# Patient Record
Sex: Female | Born: 1960 | Race: White | Hispanic: No | Marital: Married | Smoking: Never smoker
Health system: Southern US, Community
[De-identification: ages and names within clinical notes are randomized; demographics above are authoritative.]

## PROBLEM LIST (undated history)

## (undated) DIAGNOSIS — J45909 Unspecified asthma, uncomplicated: Secondary | ICD-10-CM

## (undated) DIAGNOSIS — R55 Syncope and collapse: Secondary | ICD-10-CM

## (undated) HISTORY — PX: APPENDECTOMY: SHX54

## (undated) HISTORY — DX: Unspecified asthma, uncomplicated: J45.909

---

## 1998-08-11 ENCOUNTER — Encounter: Payer: Self-pay | Admitting: Emergency Medicine

## 1998-08-11 ENCOUNTER — Emergency Department (HOSPITAL_COMMUNITY): Admission: EM | Admit: 1998-08-11 | Discharge: 1998-08-11 | Payer: Self-pay | Admitting: Emergency Medicine

## 2000-11-14 ENCOUNTER — Other Ambulatory Visit: Admission: RE | Admit: 2000-11-14 | Discharge: 2000-11-14 | Payer: Self-pay | Admitting: Obstetrics & Gynecology

## 2001-07-13 ENCOUNTER — Other Ambulatory Visit: Admission: RE | Admit: 2001-07-13 | Discharge: 2001-07-13 | Payer: Self-pay | Admitting: Obstetrics & Gynecology

## 2005-02-24 ENCOUNTER — Emergency Department (HOSPITAL_COMMUNITY): Admission: EM | Admit: 2005-02-24 | Discharge: 2005-02-24 | Payer: Self-pay | Admitting: Emergency Medicine

## 2013-01-17 ENCOUNTER — Observation Stay (HOSPITAL_COMMUNITY)
Admission: EM | Admit: 2013-01-17 | Discharge: 2013-01-19 | Disposition: A | Discharge status: Home / Self Care | Payer: BC Managed Care – PPO | Attending: Surgery | Admitting: Surgery

## 2013-01-17 ENCOUNTER — Encounter (HOSPITAL_COMMUNITY): Payer: Self-pay | Admitting: Emergency Medicine

## 2013-01-17 ENCOUNTER — Emergency Department (HOSPITAL_COMMUNITY): Payer: BC Managed Care – PPO

## 2013-01-17 DIAGNOSIS — R7401 Elevation of levels of liver transaminase levels: Secondary | ICD-10-CM | POA: Diagnosis present

## 2013-01-17 DIAGNOSIS — R7402 Elevation of levels of lactic acid dehydrogenase (LDH): Secondary | ICD-10-CM | POA: Insufficient documentation

## 2013-01-17 DIAGNOSIS — N390 Urinary tract infection, site not specified: Secondary | ICD-10-CM | POA: Insufficient documentation

## 2013-01-17 DIAGNOSIS — K8010 Calculus of gallbladder with chronic cholecystitis without obstruction: Secondary | ICD-10-CM | POA: Diagnosis present

## 2013-01-17 LAB — CBC WITH DIFFERENTIAL/PLATELET
Basophils Absolute: 0 10*3/uL (ref 0.0–0.1)
Eosinophils Relative: 1 % (ref 0–5)
Lymphs Abs: 0.9 10*3/uL (ref 0.7–4.0)
MCV: 91.2 fL (ref 78.0–100.0)
Neutro Abs: 5.9 10*3/uL (ref 1.7–7.7)
Neutrophils Relative %: 79 % — ABNORMAL HIGH (ref 43–77)
Platelets: 308 10*3/uL (ref 150–400)
RDW: 12.3 % (ref 11.5–15.5)
WBC: 7.4 10*3/uL (ref 4.0–10.5)

## 2013-01-17 LAB — URINALYSIS, ROUTINE W REFLEX MICROSCOPIC
Glucose, UA: NEGATIVE mg/dL
Hgb urine dipstick: NEGATIVE
Specific Gravity, Urine: 1.026 (ref 1.005–1.030)

## 2013-01-17 LAB — COMPREHENSIVE METABOLIC PANEL
AST: 321 U/L — ABNORMAL HIGH (ref 0–37)
CO2: 25 mEq/L (ref 19–32)
GFR calc Af Amer: >90 mL/min (ref >90)
GFR calc non Af Amer: >90 mL/min (ref >90)
Glucose, Bld: 98 mg/dL (ref 70–99)
Sodium: 139 mEq/L (ref 135–145)
Total Protein: 7.3 g/dL (ref 6.0–8.3)

## 2013-01-17 LAB — URINE MICROSCOPIC-ADD ON

## 2013-01-17 LAB — LIPASE, BLOOD: Lipase: 111 U/L — ABNORMAL HIGH (ref 11–59)

## 2013-01-17 MED ORDER — IOHEXOL 300 MG/ML  SOLN
100.0000 mL | Freq: Once | INTRAMUSCULAR | Status: AC | PRN
  Administered 2013-01-17: 100 mL via INTRAVENOUS

## 2013-01-17 MED ORDER — MORPHINE SULFATE 2 MG/ML IJ SOLN
2.0000 mg | Freq: Once | INTRAMUSCULAR | Status: AC
  Administered 2013-01-17: 2 mg via INTRAVENOUS
  Filled 2013-01-17: qty 1

## 2013-01-17 MED ORDER — ONDANSETRON HCL 4 MG/2ML IJ SOLN
4.0000 mg | Freq: Once | INTRAMUSCULAR | Status: AC
  Administered 2013-01-17: 4 mg via INTRAVENOUS
  Filled 2013-01-17: qty 2

## 2013-01-17 MED ORDER — SODIUM CHLORIDE 0.9 % IV BOLUS (SEPSIS)
1000.0000 mL | Freq: Once | INTRAVENOUS | Status: AC
  Administered 2013-01-17: 1000 mL via INTRAVENOUS

## 2013-01-17 MED ORDER — IOHEXOL 300 MG/ML  SOLN
25.0000 mL | INTRAMUSCULAR | Status: DC
  Administered 2013-01-17: 25 mL via ORAL

## 2013-01-17 NOTE — ED Provider Notes (Signed)
This patient was seen in conjunction with the resident physician, Dr. Yetta Barre.  The documentation is accurate and details the evaluation.  On my exam, patient was feeling better, had received analgesia.  With her description of epigastric and right upper quadrant pain, hepatobiliary dysfunction was an immediate suspicion.  Patient's evaluation was most notable for elevated transaminases, and CT findings are notable for enhanced walls of the gallbladder with distention, prominent intrahepatic biliary ducts.  With concern for cholecystitis, we consulted our surgical colleagues.  This was pending on sign-out.  Gerhard Munch, MD 01/17/13 [phone]

## 2013-01-17 NOTE — ED Provider Notes (Signed)
CSN: [id number]     Arrival date & time 01/17/13  1759 History   None    Chief Complaint  Patient presents with  . Abdominal Pain   (Consider location/radiation/quality/duration/timing/severity/associated sxs/prior Treatment) HPI Grace Tucker is a 52 y.o. female w/ no significant PMHx, presents to the ED w/ complaints of severe epigastric pain since Sunday night. She says the pain started after she ate a cheeseburger w/ a milkshake. Since that time, she has not been able to eat or drink anything 2/2 decreased appetite and nausea, but with no vomiting. The patient claims the pain is a very sharp stabbing pain, 10/10 in severity, radiating to her back, also w/ some RUQ pain. She claims there is nothing she can do to make it feel better and also says it is much worse after eating. She denies any fever, chills, no diarrhea, melena, or hematochezia. She denies a history of gallstones, no cholecystectomy in the past, no alcohol abuse. She also describes some recent dizziness, lightheadedness and weakness since she has not been able to take anything po. The patient was seen at urgent care yesterday, was given a GI cocktail with some relief. She was also given a prescription for Cipro for a UTI.   History reviewed. No pertinent past medical history. History reviewed. No pertinent past surgical history. History reviewed. No pertinent family history. History  Substance Use Topics  . Smoking status: Never Smoker   . Smokeless tobacco: Not on file  . Alcohol Use: No   OB History   Grav Para Term Preterm Abortions TAB SAB Ect Mult Living                 Review of Systems General: Positive for decreased appetite, and fatigue. Denies fever, chills, diaphoresis. Respiratory: Denies SOB, DOE, cough, chest tightness, and wheezing.   Cardiovascular: Denies chest pain, palpitations and leg swelling.  Gastrointestinal: Positive for nausea, and severe epigastric pain. Denies vomiting,  diarrhea, constipation, blood in stool and abdominal distention.  Genitourinary: Denies dysuria, urgency, frequency, hematuria, flank pain and difficulty urinating.  Musculoskeletal: Denies myalgias, back pain, joint swelling, arthralgias and gait problem.  Skin: Denies pallor, rash and wounds.  Neurological: Positive for dizziness, lightheadedness, and weakness. Denies seizures, syncope, numbness and headaches.  Psychiatric/Behavioral: Denies mood changes, confusion, nervousness, sleep disturbance and agitation.  Allergies  Ceclor  Home Medications   Current Outpatient Rx  Name  Route  Sig  Dispense  Refill  . acetaminophen (TYLENOL) 500 MG tablet   Oral   Take 1,000 mg by mouth every 6 (six) hours as needed for mild pain.         Marland Kitchen bismuth subsalicylate (PEPTO BISMOL) 262 MG chewable tablet   Oral   Chew 524 mg by mouth daily as needed for indigestion.         . ciprofloxacin (CIPRO) 500 MG tablet   Oral   Take 500 mg by mouth 2 (two) times daily. For 7 days.         Marland Kitchen ibuprofen (ADVIL,MOTRIN) 200 MG tablet   Oral   Take 200 mg by mouth every 6 (six) hours as needed for moderate pain.         Marland Kitchen omeprazole (PRILOSEC) 40 MG capsule   Oral   Take 40 mg by mouth at bedtime.         . ondansetron (ZOFRAN-ODT) 4 MG disintegrating tablet   Oral   Take 4 mg by mouth every 6 (six) hours as  needed for nausea or vomiting. For 3 days         . ranitidine (ZANTAC) 150 MG tablet   Oral   Take 150 mg by mouth daily.           Physical Exam Filed Vitals:   01/17/13 2030 01/17/13 2134 01/17/13 2230 01/17/13 2304  BP: 145/70 125/70 115/68 100/67  Pulse: 69 80 76   Temp:      TempSrc:      Resp:      SpO2: 100% 98% 99%    General: Vital signs reviewed.  Patient is a well-developed and well-nourished, in moderate distress d/t pain, cooperative with exam. Alert and oriented x3.  Head: Normocephalic and atraumatic. Eyes: PERRL, EOMI, conjunctivae normal, No scleral  icterus.  Neck: Supple, trachea midline, normal ROM, No JVD, masses, thyromegaly, or carotid bruit present.  Cardiovascular: RRR, S1 normal, S2 normal, no murmurs, gallops, or rubs. Pulmonary/Chest: Normal respiratory effort, CTAB, no wheezes, rales, or rhonchi. Abdominal: Soft, very tender in the epigastrium as well as tenderness in RUQ. Non-distended, bowel sounds are normal, no masses, organomegaly, or guarding present.  Musculoskeletal: No joint deformities, erythema, or stiffness, ROM full and no nontender. Extremities: No swelling or edema,  pulses symmetric and intact bilaterally. No cyanosis or clubbing. Neurological: A&O x3, Strength is normal and symmetric bilaterally, cranial nerve II-XII are grossly intact, no focal motor deficit, sensory intact to light touch bilaterally.  Skin: Warm, dry and intact. No rashes or erythema. Psychiatric: Normal mood and affect. speech and behavior is normal. Cognition and memory are normal.   ED Course  Procedures (including critical care time) Labs Review Labs Reviewed  CBC WITH DIFFERENTIAL - Abnormal; Notable for the following:    Neutrophils Relative % 79 (*)    All other components within normal limits  COMPREHENSIVE METABOLIC PANEL - Abnormal; Notable for the following:    AST 321 (*)    ALT 857 (*)    Alkaline Phosphatase 264 (*)    Total Bilirubin 2.9 (*)    All other components within normal limits  LIPASE, BLOOD - Abnormal; Notable for the following:    Lipase 111 (*)    All other components within normal limits  URINALYSIS, ROUTINE W REFLEX MICROSCOPIC - Abnormal; Notable for the following:    Color, Urine AMBER (*)    APPearance HAZY (*)    Bilirubin Urine SMALL (*)    Ketones, ur >80 (*)    Protein, ur 30 (*)    Urobilinogen, UA 2.0 (*)    Leukocytes, UA LARGE (*)    All other components within normal limits  URINE MICROSCOPIC-ADD ON - Abnormal; Notable for the following:    Squamous Epithelial / LPF MANY (*)    Bacteria,  UA MANY (*)    All other components within normal limits  URINE CULTURE   Imaging Review Ct Abdomen Pelvis W Contrast  01/17/2013   CLINICAL DATA:  Upper to mid abdominal pain and nausea.  EXAM: CT ABDOMEN AND PELVIS WITH CONTRAST  TECHNIQUE: Multidetector CT imaging of the abdomen and pelvis was performed using the standard protocol following bolus administration of intravenous contrast.  CONTRAST:  OMNIPAQUE IOHEXOL 300 MG/ML  SOLN  COMPARISON:  CT of the abdomen and pelvis performed 02/24/2005, and abdominal ultrasound performed earlier today at 8:48 p.m.  FINDINGS: The visualized lung bases are clear.  There is mild distention of the gallbladder, with mildly increased wall enhancement and perhaps trace pericholecystic fluid on coronal  images. There is associated mild prominence of the intrahepatic biliary ducts, slightly more prominent than in 2007. The common hepatic duct is dilated to 9 mm in maximal diameter. This raises concern for distal obstruction, though no definite stone is seen.  A 6 mm hypodensity within the right hepatic lobe is only minimally changed from 2007 and is likely benign. The liver is otherwise unremarkable in appearance. The spleen is within normal limits. The pancreas and adrenal glands are otherwise unremarkable.  A few small hypodensities are noted within the left kidney, likely reflecting small cysts. The kidneys are otherwise unremarkable in appearance. There is no evidence of hydronephrosis. No renal or ureteral stones are seen. No perinephric stranding is appreciated.  The small bowel is unremarkable in appearance. The stomach is within normal limits. No acute vascular abnormalities are seen.  The appendix is not definitely seen; there is no evidence for appendicitis. The colon is unremarkable in appearance.  The bladder is mildly distended and grossly unremarkable. The uterus is within normal limits. The ovaries are relatively symmetric. No suspicious adnexal masses  are seen. A small amount of free fluid within the pelvis is likely physiologic in nature. No inguinal lymphadenopathy is seen.  No acute osseous abnormalities are identified.  IMPRESSION: 1. Mild gallbladder distention, with mildly increased wall enhancement and perhaps trace pericholecystic fluid. Mild prominence of the intrahepatic biliary ducts, and dilatation of the common hepatic duct to 9 mm in maximal diameter. This raises concern for distal obstruction, though no definite stone is seen. MRCP or ERCP could be considered for further evaluation. 2. Likely tiny hepatic cyst and small left renal cysts.   Electronically Signed   By: Roanna Raider M.D.   On: 01/17/2013 23:33   US Abdomen Limited  01/17/2013   CLINICAL DATA:  Epigastric pain.  Nausea.  EXAM: US ABDOMEN LIMITED - RIGHT UPPER QUADRANT  COMPARISON:  CT, 02/24/2005  FINDINGS: Gallbladder  Multiple gallstones. No wall thickening or pericholecystic fluid. No evidence of acute cholecystitis.  Common bile duct  Diameter: 12 mm proximally with some distal tapering. No duct stone is seen.  Liver:  Mild central intrahepatic bile duct dilation. Liver is normal in size and overall echogenicity. No mass or focal lesion.  Oval soft tissue mass is seen along the distal posterior gastrohepatic ligament most likely an enlarged lymph node. It measures 3.2 cm x 1.8 cm x 1.9 cm. A smaller, 10.5 cm short axis, node was noted in this location on the prior CT.  IMPRESSION: 1. Intra and extra hepatic bile duct dilation, increased when compared to the prior CT. The most distal aspect the duct was not fully visualized. A distal stone is not excluded. 2. Oval soft tissue mass along the posterior gastrohepatic ligament most likely an enlarged lymph node, larger than it was on the prior CT. 3. Gallstones without evidence of acute cholecystitis. 4. Recommend followup CT of the abdomen with contrast for further evaluation of the gastrohepatic ligament mass/ adenopathy and  duct dilation.   Electronically Signed   By: Amie Portland M.D.   On: 01/17/2013 21:47    EKG Interpretation   None       MDM   Grace Tucker is a 52 y.o. female w/ no significant PMHx, presents to the ED w/ complaints of severe epigastric pain after a large meal on Sunday. The pain is a sharp stabbing pain, radiating to the back w/ significant epigastric tenderness on exam as well as RUQ. Patient denies  significant alcohol intake, no history of gall stones in the past. Had an appendectomy as a child. Given clinical presentation, obvious concern for pancreatitis, cholecystitis, and peptic ulcer disease.  -Given NS 1L bolus. -Morphine 4 mg IV for pain + Zofran 4 mg for nausea w/ significant improvement in symptoms. -CBC wnl, but w/ relative PMNs of 79% -CMET shows Alk phos of 264, AST 321, ALT 857, total bili of 2.9, and lipase of 111. Given these findings, suspect cholecystitis. -Abdominal US shows extra-hepatic bile duct dilation, w/ distal stone not excluded. No evidence of cholecystitis (see full details above). Recommended CT abdomen w/ contrast. -CT abdomen shows mild gallbladder distention, with mildly increased wall enhancement and perhaps trace pericholecystic fluid. Also w/ mild prominence of the intrahepatic biliary ducts, and dilatation of the common hepatic duct to 9 mm in maximal diameter.  -UA also performed, showed signs of UTI (see above), however patient already taking Cipro for this as given by urgent care.  Given LFT's, clinical presentation, and imaging, discussed w/ surgery, will comes see patient.   Courtney Paris, MD 01/17/13 2350

## 2013-01-17 NOTE — ED Notes (Signed)
Notified CT that pt had finished contrast.  

## 2013-01-17 NOTE — ED Notes (Signed)
Patient transported to CT 

## 2013-01-17 NOTE — ED Notes (Signed)
Pt c/o upper to mid abd pain with nausea intermittent after eating since Sunday

## 2013-01-18 ENCOUNTER — Inpatient Hospital Stay (HOSPITAL_COMMUNITY): Payer: BC Managed Care – PPO

## 2013-01-18 ENCOUNTER — Encounter (HOSPITAL_COMMUNITY): Admission: EM | Disposition: A | Discharge status: Home / Self Care | Payer: Self-pay | Source: Home / Self Care

## 2013-01-18 ENCOUNTER — Encounter (HOSPITAL_COMMUNITY): Payer: BC Managed Care – PPO | Admitting: Anesthesiology

## 2013-01-18 ENCOUNTER — Encounter (HOSPITAL_COMMUNITY): Payer: Self-pay | Admitting: *Deleted

## 2013-01-18 ENCOUNTER — Inpatient Hospital Stay (HOSPITAL_COMMUNITY): Payer: BC Managed Care – PPO | Admitting: Anesthesiology

## 2013-01-18 DIAGNOSIS — R740 Nonspecific elevation of levels of transaminase and lactic acid dehydrogenase [LDH]: Secondary | ICD-10-CM

## 2013-01-18 DIAGNOSIS — K8010 Calculus of gallbladder with chronic cholecystitis without obstruction: Secondary | ICD-10-CM

## 2013-01-18 DIAGNOSIS — K8020 Calculus of gallbladder without cholecystitis without obstruction: Secondary | ICD-10-CM

## 2013-01-18 HISTORY — PX: CHOLECYSTECTOMY: SHX55

## 2013-01-18 LAB — CBC
HCT: 35 % — ABNORMAL LOW (ref 36.0–46.0)
Hemoglobin: 12.1 g/dL (ref 12.0–15.0)
MCHC: 34.6 g/dL (ref 30.0–36.0)
Platelets: 257 10*3/uL (ref 150–400)
RBC: 3.86 MIL/uL — ABNORMAL LOW (ref 3.87–5.11)
WBC: 7.2 10*3/uL (ref 4.0–10.5)

## 2013-01-18 LAB — COMPREHENSIVE METABOLIC PANEL
ALT: 587 U/L — ABNORMAL HIGH (ref 0–35)
Albumin: 3.2 g/dL — ABNORMAL LOW (ref 3.5–5.2)
BUN: 7 mg/dL (ref 6–23)
CO2: 20 mEq/L (ref 19–32)
Calcium: 8 mg/dL — ABNORMAL LOW (ref 8.4–10.5)
Chloride: 107 mEq/L (ref 96–112)
Creatinine, Ser: 0.71 mg/dL (ref 0.50–1.10)
GFR calc Af Amer: >90 mL/min (ref >90)
GFR calc non Af Amer: >90 mL/min (ref >90)
Total Bilirubin: 1.4 mg/dL — ABNORMAL HIGH (ref 0.3–1.2)
Total Protein: 5.7 g/dL — ABNORMAL LOW (ref 6.0–8.3)

## 2013-01-18 LAB — MRSA PCR SCREENING: MRSA by PCR: NEGATIVE

## 2013-01-18 LAB — URINE CULTURE: Culture: NO GROWTH

## 2013-01-18 LAB — LIPASE, BLOOD: Lipase: 73 U/L — ABNORMAL HIGH (ref 11–59)

## 2013-01-18 SURGERY — LAPAROSCOPIC CHOLECYSTECTOMY WITH INTRAOPERATIVE CHOLANGIOGRAM
Anesthesia: General

## 2013-01-18 MED ORDER — PROPOFOL 10 MG/ML IV BOLUS
INTRAVENOUS | Status: DC | PRN
  Administered 2013-01-18: 50 mg via INTRAVENOUS
  Administered 2013-01-18: 150 mg via INTRAVENOUS

## 2013-01-18 MED ORDER — BUPIVACAINE-EPINEPHRINE (PF) 0.25% -1:200000 IJ SOLN
INTRAMUSCULAR | Status: AC
  Filled 2013-01-18: qty 30

## 2013-01-18 MED ORDER — MORPHINE SULFATE 2 MG/ML IJ SOLN
2.0000 mg | INTRAMUSCULAR | Status: DC | PRN
  Administered 2013-01-18 – 2013-01-19 (×3): 2 mg via INTRAVENOUS
  Filled 2013-01-18 (×3): qty 1

## 2013-01-18 MED ORDER — BIOTENE DRY MOUTH MT LIQD: 15.0000 mL | Freq: Two times a day (BID) | OROMUCOSAL | Status: DC

## 2013-01-18 MED ORDER — DIPHENHYDRAMINE HCL 50 MG/ML IJ SOLN: 12.5000 mg | Freq: Four times a day (QID) | INTRAMUSCULAR | Status: DC | PRN

## 2013-01-18 MED ORDER — DIPHENHYDRAMINE HCL 12.5 MG/5ML PO ELIX: 12.5000 mg | ORAL_SOLUTION | Freq: Four times a day (QID) | ORAL | Status: DC | PRN

## 2013-01-18 MED ORDER — INFLUENZA VAC SPLIT QUAD 0.5 ML IM SUSP
0.5000 mL | INTRAMUSCULAR | Status: DC
  Filled 2013-01-18: qty 0.5

## 2013-01-18 MED ORDER — MIDAZOLAM HCL 5 MG/5ML IJ SOLN
INTRAMUSCULAR | Status: DC | PRN
  Administered 2013-01-18: 2 mg via INTRAVENOUS

## 2013-01-18 MED ORDER — GLYCOPYRROLATE 0.2 MG/ML IJ SOLN
INTRAMUSCULAR | Status: DC | PRN
  Administered 2013-01-18: 0.6 mg via INTRAVENOUS

## 2013-01-18 MED ORDER — SODIUM CHLORIDE 0.9 % IV SOLN
INTRAVENOUS | Status: DC | PRN
  Administered 2013-01-18: 14:00:00

## 2013-01-18 MED ORDER — SUCCINYLCHOLINE CHLORIDE 20 MG/ML IJ SOLN
INTRAMUSCULAR | Status: DC | PRN
  Administered 2013-01-18: 120 mg via INTRAVENOUS

## 2013-01-18 MED ORDER — OXYCODONE HCL 5 MG/5ML PO SOLN: 5.0000 mg | Freq: Once | ORAL | Status: DC | PRN

## 2013-01-18 MED ORDER — LIDOCAINE HCL (CARDIAC) 20 MG/ML IV SOLN
INTRAVENOUS | Status: DC | PRN
  Administered 2013-01-18: 50 mg via INTRAVENOUS

## 2013-01-18 MED ORDER — ONDANSETRON HCL 4 MG/2ML IJ SOLN
4.0000 mg | Freq: Four times a day (QID) | INTRAMUSCULAR | Status: DC | PRN
  Administered 2013-01-18: 4 mg via INTRAVENOUS
  Filled 2013-01-18: qty 2

## 2013-01-18 MED ORDER — ONDANSETRON HCL 4 MG/2ML IJ SOLN
INTRAMUSCULAR | Status: DC | PRN
  Administered 2013-01-18: 4 mg via INTRAVENOUS

## 2013-01-18 MED ORDER — OXYCODONE HCL 5 MG PO TABS: 5.0000 mg | ORAL_TABLET | Freq: Once | ORAL | Status: DC | PRN

## 2013-01-18 MED ORDER — SODIUM CHLORIDE 0.9 % IR SOLN
Status: DC | PRN
  Administered 2013-01-18: 1000 mL

## 2013-01-18 MED ORDER — ROCURONIUM BROMIDE 100 MG/10ML IV SOLN
INTRAVENOUS | Status: DC | PRN
  Administered 2013-01-18: 30 mg via INTRAVENOUS

## 2013-01-18 MED ORDER — PANTOPRAZOLE SODIUM 40 MG IV SOLR
40.0000 mg | Freq: Every day | INTRAVENOUS | Status: DC
  Administered 2013-01-18 (×2): 40 mg via INTRAVENOUS
  Filled 2013-01-18 (×3): qty 40

## 2013-01-18 MED ORDER — HYDROMORPHONE HCL PF 1 MG/ML IJ SOLN
INTRAMUSCULAR | Status: AC
  Filled 2013-01-18: qty 1

## 2013-01-18 MED ORDER — ACETAMINOPHEN 325 MG PO TABS
650.0000 mg | ORAL_TABLET | Freq: Four times a day (QID) | ORAL | Status: DC | PRN
  Filled 2013-01-18: qty 2

## 2013-01-18 MED ORDER — CHLORHEXIDINE GLUCONATE 0.12 % MT SOLN
15.0000 mL | Freq: Two times a day (BID) | OROMUCOSAL | Status: DC
  Administered 2013-01-18: 15 mL via OROMUCOSAL
  Filled 2013-01-18: qty 15

## 2013-01-18 MED ORDER — LACTATED RINGERS IV SOLN
INTRAVENOUS | Status: DC | PRN
  Administered 2013-01-18 (×2): via INTRAVENOUS

## 2013-01-18 MED ORDER — 0.9 % SODIUM CHLORIDE (POUR BTL) OPTIME
TOPICAL | Status: DC | PRN
  Administered 2013-01-18: 1000 mL

## 2013-01-18 MED ORDER — NEOSTIGMINE METHYLSULFATE 1 MG/ML IJ SOLN
INTRAMUSCULAR | Status: DC | PRN
  Administered 2013-01-18: 4 mg via INTRAVENOUS

## 2013-01-18 MED ORDER — BUPIVACAINE-EPINEPHRINE 0.25% -1:200000 IJ SOLN
INTRAMUSCULAR | Status: DC | PRN
  Administered 2013-01-18: 30 mL

## 2013-01-18 MED ORDER — ONDANSETRON HCL 4 MG/2ML IJ SOLN: 4.0000 mg | Freq: Four times a day (QID) | INTRAMUSCULAR | Status: DC | PRN

## 2013-01-18 MED ORDER — CIPROFLOXACIN IN D5W 400 MG/200ML IV SOLN
400.0000 mg | Freq: Two times a day (BID) | INTRAVENOUS | Status: DC
  Administered 2013-01-18 – 2013-01-19 (×4): 400 mg via INTRAVENOUS
  Filled 2013-01-18 (×6): qty 200

## 2013-01-18 MED ORDER — KCL IN DEXTROSE-NACL 20-5-0.45 MEQ/L-%-% IV SOLN
INTRAVENOUS | Status: DC
  Administered 2013-01-18 – 2013-01-19 (×3): via INTRAVENOUS
  Filled 2013-01-18 (×6): qty 1000

## 2013-01-18 MED ORDER — POTASSIUM CHLORIDE 10 MEQ/100ML IV SOLN
10.0000 meq | INTRAVENOUS | Status: AC
  Administered 2013-01-18 (×3): 10 meq via INTRAVENOUS
  Filled 2013-01-18 (×3): qty 100

## 2013-01-18 MED ORDER — INFLUENZA VAC SPLIT QUAD 0.5 ML IM SUSP: 0.5000 mL | INTRAMUSCULAR | Status: DC

## 2013-01-18 MED ORDER — FENTANYL CITRATE 0.05 MG/ML IJ SOLN
INTRAMUSCULAR | Status: DC | PRN
  Administered 2013-01-18: 50 ug via INTRAVENOUS
  Administered 2013-01-18: 100 ug via INTRAVENOUS
  Administered 2013-01-18: 50 ug via INTRAVENOUS
  Administered 2013-01-18: 100 ug via INTRAVENOUS
  Administered 2013-01-18: 50 ug via INTRAVENOUS

## 2013-01-18 MED ORDER — HYDROMORPHONE HCL PF 1 MG/ML IJ SOLN
0.2500 mg | INTRAMUSCULAR | Status: DC | PRN
  Administered 2013-01-18: 0.5 mg via INTRAVENOUS

## 2013-01-18 SURGICAL SUPPLY — 39 items
APPLIER CLIP ROT 10 11.4 M/L (STAPLE) ×2
BENZOIN TINCTURE PRP APPL 2/3 (GAUZE/BANDAGES/DRESSINGS) ×2 IMPLANT
CANISTER SUCTION 2500CC (MISCELLANEOUS) ×2 IMPLANT
CHLORAPREP W/TINT 26ML (MISCELLANEOUS) ×2 IMPLANT
CLIP APPLIE ROT 10 11.4 M/L (STAPLE) ×1 IMPLANT
COVER MAYO STAND STRL (DRAPES) ×2 IMPLANT
COVER SURGICAL LIGHT HANDLE (MISCELLANEOUS) ×2 IMPLANT
DECANTER SPIKE VIAL GLASS SM (MISCELLANEOUS) IMPLANT
DRAPE C-ARM 42X72 X-RAY (DRAPES) ×2 IMPLANT
DRAPE UTILITY 15X26 W/TAPE STR (DRAPE) ×4 IMPLANT
ELECT REM PT RETURN 9FT ADLT (ELECTROSURGICAL) ×2
ELECTRODE REM PT RTRN 9FT ADLT (ELECTROSURGICAL) ×1 IMPLANT
ENDOLOOP SUT PDS II  0 18 (SUTURE) ×1
ENDOLOOP SUT PDS II 0 18 (SUTURE) ×1 IMPLANT
GAUZE SPONGE 2X2 8PLY STRL LF (GAUZE/BANDAGES/DRESSINGS) ×1 IMPLANT
GLOVE SURG ORTHO 8.0 STRL STRW (GLOVE) ×2 IMPLANT
GOWN STRL NON-REIN LRG LVL3 (GOWN DISPOSABLE) ×4 IMPLANT
GOWN STRL REIN XL XLG (GOWN DISPOSABLE) ×2 IMPLANT
KIT BASIN OR (CUSTOM PROCEDURE TRAY) ×2 IMPLANT
KIT ROOM TURNOVER OR (KITS) ×2 IMPLANT
NS IRRIG 1000ML POUR BTL (IV SOLUTION) ×2 IMPLANT
PAD ARMBOARD 7.5X6 YLW CONV (MISCELLANEOUS) ×4 IMPLANT
POUCH SPECIMEN RETRIEVAL 10MM (ENDOMECHANICALS) ×2 IMPLANT
SCISSORS LAP 5X35 DISP (ENDOMECHANICALS) ×2 IMPLANT
SET CHOLANGIOGRAPH 5 50 .035 (SET/KITS/TRAYS/PACK) ×2 IMPLANT
SET IRRIG TUBING LAPAROSCOPIC (IRRIGATION / IRRIGATOR) ×2 IMPLANT
SLEEVE ENDOPATH XCEL 5M (ENDOMECHANICALS) ×2 IMPLANT
SPECIMEN JAR SMALL (MISCELLANEOUS) ×2 IMPLANT
SPONGE GAUZE 2X2 STER 10/PKG (GAUZE/BANDAGES/DRESSINGS) ×1
STRIP CLOSURE SKIN 1/4X4 (GAUZE/BANDAGES/DRESSINGS) ×2 IMPLANT
SUT MNCRL AB 4-0 PS2 18 (SUTURE) ×2 IMPLANT
TAPE CLOTH SURG 6X10 WHT LF (GAUZE/BANDAGES/DRESSINGS) ×2 IMPLANT
TOWEL OR 17X24 6PK STRL BLUE (TOWEL DISPOSABLE) IMPLANT
TOWEL OR 17X26 10 PK STRL BLUE (TOWEL DISPOSABLE) ×2 IMPLANT
TRAY LAPAROSCOPIC (CUSTOM PROCEDURE TRAY) ×2 IMPLANT
TROCAR XCEL BLUNT TIP 100MML (ENDOMECHANICALS) ×2 IMPLANT
TROCAR XCEL NON-BLD 11X100MML (ENDOMECHANICALS) ×2 IMPLANT
TROCAR XCEL NON-BLD 5MMX100MML (ENDOMECHANICALS) ×2 IMPLANT
WATER STERILE IRR 1000ML POUR (IV SOLUTION) IMPLANT

## 2013-01-18 NOTE — Anesthesia Procedure Notes (Signed)
Procedure Name: Intubation Date/Time: 01/18/2013 1:27 PM Performed by: Brien Mates D Pre-anesthesia Checklist: Patient identified, Emergency Drugs available, Suction available, Timeout performed and Patient being monitored Patient Re-evaluated:Patient Re-evaluated prior to inductionOxygen Delivery Method: Circle system utilized Preoxygenation: Pre-oxygenation with 100% oxygen Intubation Type: IV induction, Rapid sequence and Cricoid Pressure applied Laryngoscope Size: Miller and 2 Grade View: Grade I Tube type: Oral Number of attempts: 1 Airway Equipment and Method: Stylet and Bougie stylet Placement Confirmation: positive ETCO2,  breath sounds checked- equal and bilateral and ETT inserted through vocal cords under direct vision Secured at: 21 cm Tube secured with: Tape Dental Injury: Teeth and Oropharynx as per pre-operative assessment

## 2013-01-18 NOTE — Op Note (Signed)
Procedure Note  Pre-operative Diagnosis:  Subacute cholecystitis, cholelithiasis, rule out choledocholithiasis  Post-operative Diagnosis:  same  Surgeon:  Velora Heckler, MD, FACS  Assistant:  Magnus Ivan, RNFA   Procedure:  Laparoscopic cholecystectomy with intra-operative cholangiography  Anesthesia:  General  Estimated Blood Loss:  minimal  Drains: none         Specimen: Gallbladder to pathology  Indications:  Patient admitted to surgical service with abdominal pain and elevated LFT's.  USN shows gallstones and dilated bile ducts.  Improved this AM.  To OR for lap chole with IOC.  Procedure Details:  The patient was seen in the pre-op holding area. The risks, benefits, complications, treatment options, and expected outcomes have been discussed with the patient. The patient agreed with the proposed plan and signed the informed consent form.  The patient was taken to Operating Room, identified as Grace Tucker and the procedure verified as Laparoscopic Cholecystectomy with Intraoperative Cholangiogram. A "time out" was completed and the above information confirmed.  Following induction of general anesthesia, the patient was placed in the supine position. The abdomen was prepped and draped in the usual aseptic fashion.  An incision was made in the skin below the umbilicus. The midline fascia was incised and the peritoneal cavity entered and the Hasson canula was introduced under direct vision.  The Hasson canula was secured with a 0-Vicryl pursestring suture. Pneumoperitoneum was established with carbon dioxide. Additional trocars were introduced under direct vision along the right costal margin in the midline, mid-clavicular line, and anterior axillary line.   The gallbladder was identified and the fundus grasped and retracted cephalad. Extensive inflammatory adhesions were taken down bluntly and the electrocautery was utilized as needed, taking care not to injure any  adjacent structures. The infundibulum was grasped and retracted laterally, exposing the peritoneum overlying the triangle of Calot. The peritoneum was incised and structures exposed with blunt dissection. The cystic duct was clearly identified, bluntly dissected circumferentially, and clipped at the neck of the gallbladder.  An incision was made in the cystic duct and the cholangiogram catheter introduced. The catheter was secured using an ligaclip.  Real-time cholangiography was performed using C-arm fluoroscopy.  There was rapid filling of a dilated caliber common bile duct.  There was reflux of contrast into the left and right hepatic ductal systems.  There was free flow distally into the duodenum without significant filling defect or obstruction.  Cholangiogram was reviewed by Dr. Geanie Cooley from radiology. Catheter was removed from the peritoneal cavity.  The cystic duct was then doubly ligated with surgical clips and divided. A PDS endoloop was also placed around the cystic duct. The cystic artery was identified, dissected circumferentially, ligated with ligaclips, and divided.  The gallbladder was dissected away from the liver bed using the electrocautery for hemostasis. The gallbladder was completely removed from the liver and placed into an endocatch bag. The right upper quadrant was irrigated and the gallbladder bed was inspected. Hemostasis was achieved with the electrocautery. Warm saline irrigation was utilized until clear.  Pneumoperitoneum was released after viewing removal of the trocars with good hemostasis noted. The umbilical wound was irrigated and the fascia was then closed with the pursestring suture.  Local anesthetic was infiltrated at all port sites. The skin incisions were closed with 4-0 Monocril subcuticular sutures and steri-strips and dressings were applied.  Instrument, sponge, and needle counts were correct at the conclusion of the case.  The patient was awakened from  anesthesia and brought to the  recovery room in stable condition.  The patient tolerated the procedure well.   Velora Heckler, MD, [facility] Office: [phone]

## 2013-01-18 NOTE — Progress Notes (Signed)
Pt unable to void and c/o of severe abdominal pain,scanned pt, >950 ml of urine in bladder, In and out cath done and 1500 ml obtained.Pt felt much better.

## 2013-01-18 NOTE — Progress Notes (Signed)
Subjective: Pt's pain well controlled.  No N/V.  Ambulating some.  Tired.  Wants to have surgery today.  Objective: Vital signs in last 24 hours: Temp:  [97.6 F (36.4 C)-98.2 F (36.8 C)] 97.6 F (36.4 C) (12/12 0533) Pulse Rate:  [58-80] 71 (12/12 0533) Resp:  [18-20] 18 (12/12 0533) BP: (100-157)/(59-85) 107/59 mmHg (12/12 0533) SpO2:  [96 %-100 %] 99 % (12/12 0533) Weight:  [182 lb 14.4 oz (82.963 kg)] 182 lb 14.4 oz (82.963 kg) (12/12 0150)    Intake/Output from previous day: 12/11 0701 - 12/12 0700 In: 800 [I.V.:800] Out: -  Intake/Output this shift:    PE: Gen:  Alert, NAD, pleasant Abd: Soft, moderate tenderness in RUQ, epigastrium, ND +BS, no HSM, RLQ abdominal scars noted from open appy, no other scars noted   Lab Results:   Recent Labs  01/17/13 1810 01/18/13 0610  WBC 7.4 7.2  HGB 14.7 12.1  HCT 42.4 35.0*  PLT 308 257   BMET  Recent Labs  01/17/13 1810 01/18/13 0610  NA 139 137  K 3.5 3.2*  CL 103 107  CO2 25 20  GLUCOSE 98 94  BUN 11 7  CREATININE 0.79 0.71  CALCIUM 9.3 8.0*   PT/INR No results found for this basename: LABPROT, INR,  in the last 72 hours CMP     Component Value Date/Time   NA 137 01/18/2013 0610   K 3.2* 01/18/2013 0610   CL 107 01/18/2013 0610   CO2 20 01/18/2013 0610   GLUCOSE 94 01/18/2013 0610   BUN 7 01/18/2013 0610   CREATININE 0.71 01/18/2013 0610   CALCIUM 8.0* 01/18/2013 0610   PROT 5.7* 01/18/2013 0610   ALBUMIN 3.2* 01/18/2013 0610   AST 196* 01/18/2013 0610   ALT 587* 01/18/2013 0610   ALKPHOS 224* 01/18/2013 0610   BILITOT 1.4* 01/18/2013 0610   GFRNONAA >90 01/18/2013 0610   GFRAA >90 01/18/2013 0610   Lipase     Component Value Date/Time   LIPASE 73* 01/18/2013 0610       Studies/Results: Ct Abdomen Pelvis W Contrast  01/17/2013   CLINICAL DATA:  Upper to mid abdominal pain and nausea.  EXAM: CT ABDOMEN AND PELVIS WITH CONTRAST  TECHNIQUE: Multidetector CT imaging of the abdomen  and pelvis was performed using the standard protocol following bolus administration of intravenous contrast.  CONTRAST:  OMNIPAQUE IOHEXOL 300 MG/ML  SOLN  COMPARISON:  CT of the abdomen and pelvis performed 02/24/2005, and abdominal ultrasound performed earlier today at 8:48 p.m.  FINDINGS: The visualized lung bases are clear.  There is mild distention of the gallbladder, with mildly increased wall enhancement and perhaps trace pericholecystic fluid on coronal images. There is associated mild prominence of the intrahepatic biliary ducts, slightly more prominent than in 2007. The common hepatic duct is dilated to 9 mm in maximal diameter. This raises concern for distal obstruction, though no definite stone is seen.  A 6 mm hypodensity within the right hepatic lobe is only minimally changed from 2007 and is likely benign. The liver is otherwise unremarkable in appearance. The spleen is within normal limits. The pancreas and adrenal glands are otherwise unremarkable.  A few small hypodensities are noted within the left kidney, likely reflecting small cysts. The kidneys are otherwise unremarkable in appearance. There is no evidence of hydronephrosis. No renal or ureteral stones are seen. No perinephric stranding is appreciated.  The small bowel is unremarkable in appearance. The stomach is within normal limits. No acute  vascular abnormalities are seen.  The appendix is not definitely seen; there is no evidence for appendicitis. The colon is unremarkable in appearance.  The bladder is mildly distended and grossly unremarkable. The uterus is within normal limits. The ovaries are relatively symmetric. No suspicious adnexal masses are seen. A small amount of free fluid within the pelvis is likely physiologic in nature. No inguinal lymphadenopathy is seen.  No acute osseous abnormalities are identified.  IMPRESSION: 1. Mild gallbladder distention, with mildly increased wall enhancement and perhaps trace  pericholecystic fluid. Mild prominence of the intrahepatic biliary ducts, and dilatation of the common hepatic duct to 9 mm in maximal diameter. This raises concern for distal obstruction, though no definite stone is seen. MRCP or ERCP could be considered for further evaluation. 2. Likely tiny hepatic cyst and small left renal cysts.   Electronically Signed   By: Roanna Raider M.D.   On: 01/17/2013 23:33   US Abdomen Limited  01/17/2013   CLINICAL DATA:  Epigastric pain.  Nausea.  EXAM: US ABDOMEN LIMITED - RIGHT UPPER QUADRANT  COMPARISON:  CT, 02/24/2005  FINDINGS: Gallbladder  Multiple gallstones. No wall thickening or pericholecystic fluid. No evidence of acute cholecystitis.  Common bile duct  Diameter: 12 mm proximally with some distal tapering. No duct stone is seen.  Liver:  Mild central intrahepatic bile duct dilation. Liver is normal in size and overall echogenicity. No mass or focal lesion.  Oval soft tissue mass is seen along the distal posterior gastrohepatic ligament most likely an enlarged lymph node. It measures 3.2 cm x 1.8 cm x 1.9 cm. A smaller, 10.5 cm short axis, node was noted in this location on the prior CT.  IMPRESSION: 1. Intra and extra hepatic bile duct dilation, increased when compared to the prior CT. The most distal aspect the duct was not fully visualized. A distal stone is not excluded. 2. Oval soft tissue mass along the posterior gastrohepatic ligament most likely an enlarged lymph node, larger than it was on the prior CT. 3. Gallstones without evidence of acute cholecystitis. 4. Recommend followup CT of the abdomen with contrast for further evaluation of the gastrohepatic ligament mass/ adenopathy and duct dilation.   Electronically Signed   By: Amie Portland M.D.   On: 01/17/2013 21:47    Anti-infectives: Anti-infectives   Start     Dose/Rate Route Frequency Ordered Stop   01/18/13 0200  ciprofloxacin (CIPRO) IVPB 400 mg     400 mg 200 mL/hr over 60 Minutes  Intravenous 2 times daily 01/18/13 0110         Assessment/Plan Cholelithiasis Dilated CBD Transaminitis - improved Hyperbilirubinemia - improved S/p open appendectomy as a teen Hypokalemia - supplemented K+  Plan: 1.  OR today for lap chole with IOC given all LFT's are trending down 2.  IVF, pain control, antiemetics, antibiotics (on cipro) 3.  Ambulate and IS 4.  SCD's, no pharm dvt proph now due to OR today 5.  Hopefully home tomorrow if doing well 6.  Discussed risks and benefits of the procedure and she would like to proceed with surgery    LOS: 1 day    DORT, Tiara Maultsby 01/18/2013, 7:26 AM Pager: [phone]

## 2013-01-18 NOTE — H&P (Signed)
Grace Tucker is an 52 y.o. female.   Chief Complaint: Epigastric pain HPI: Patient presented to the emergency department with a four-day history of epigastric pain and associated nausea. She has not vomited. She has had very limited oral intake during this time period. The pain comes and goes. It does not seem exacerbated by any factors. Evaluation in the emergency department demonstrated gallstones and elevated liver function tests. I was asked to see her for further management.  History reviewed. No pertinent past medical history.  Past surgical history: Appendectomy  History reviewed. No pertinent family history. Social History:  reports that she has never smoked. She does not have any smokeless tobacco history on file. She reports that she does not drink alcohol or use illicit drugs.  Allergies:  Allergies  Allergen Reactions  . Ceclor [Cefaclor] Nausea And Vomiting     (Not in a hospital admission)  Results for orders placed during the hospital encounter of 01/17/13 (from the past 48 hour(s))  CBC WITH DIFFERENTIAL     Status: Abnormal   Collection Time    01/17/13  6:10 PM      Result Value Range   WBC 7.4  4.0 - 10.5 K/uL   RBC 4.65  3.87 - 5.11 MIL/uL   Hemoglobin 14.7  12.0 - 15.0 g/dL   HCT 16.1  09.6 - 04.5 %   MCV 91.2  78.0 - 100.0 fL   MCH 31.6  26.0 - 34.0 pg   MCHC 34.7  30.0 - 36.0 g/dL   RDW 40.9  81.1 - 91.4 %   Platelets 308  150 - 400 K/uL   Neutrophils Relative % 79 (*) 43 - 77 %   Neutro Abs 5.9  1.7 - 7.7 K/uL   Lymphocytes Relative 12  12 - 46 %   Lymphs Abs 0.9  0.7 - 4.0 K/uL   Monocytes Relative 7  3 - 12 %   Monocytes Absolute 0.5  0.1 - 1.0 K/uL   Eosinophils Relative 1  0 - 5 %   Eosinophils Absolute 0.1  0.0 - 0.7 K/uL   Basophils Relative 0  0 - 1 %   Basophils Absolute 0.0  0.0 - 0.1 K/uL  COMPREHENSIVE METABOLIC PANEL     Status: Abnormal   Collection Time    01/17/13  6:10 PM      Result Value Range   Sodium 139  135 -  145 mEq/L   Potassium 3.5  3.5 - 5.1 mEq/L   Chloride 103  96 - 112 mEq/L   CO2 25  19 - 32 mEq/L   Glucose, Bld 98  70 - 99 mg/dL   BUN 11  6 - 23 mg/dL   Creatinine, Ser 7.82  0.50 - 1.10 mg/dL   Calcium 9.3  8.4 - 95.6 mg/dL   Total Protein 7.3  6.0 - 8.3 g/dL   Albumin 4.2  3.5 - 5.2 g/dL   AST 213 (*) 0 - 37 U/L   ALT 857 (*) 0 - 35 U/L   Alkaline Phosphatase 264 (*) 39 - 117 U/L   Total Bilirubin 2.9 (*) 0.3 - 1.2 mg/dL   GFR calc non Af Amer >90  >90 mL/min   GFR calc Af Amer >90  >90 mL/min   Comment: (NOTE)     The eGFR has been calculated using the CKD EPI equation.     This calculation has not been validated in all clinical situations.     eGFR's  persistently <90 mL/min signify possible Chronic Kidney     Disease.  LIPASE, BLOOD     Status: Abnormal   Collection Time    01/17/13  6:10 PM      Result Value Range   Lipase 111 (*) 11 - 59 U/L  URINALYSIS, ROUTINE W REFLEX MICROSCOPIC     Status: Abnormal   Collection Time    01/17/13  7:59 PM      Result Value Range   Color, Urine AMBER (*) YELLOW   Comment: BIOCHEMICALS MAY BE AFFECTED BY COLOR   APPearance HAZY (*) CLEAR   Specific Gravity, Urine 1.026  1.005 - 1.030   pH 7.5  5.0 - 8.0   Glucose, UA NEGATIVE  NEGATIVE mg/dL   Hgb urine dipstick NEGATIVE  NEGATIVE   Bilirubin Urine SMALL (*) NEGATIVE   Ketones, ur >80 (*) NEGATIVE mg/dL   Protein, ur 30 (*) NEGATIVE mg/dL   Urobilinogen, UA 2.0 (*) 0.0 - 1.0 mg/dL   Nitrite NEGATIVE  NEGATIVE   Leukocytes, UA LARGE (*) NEGATIVE  URINE MICROSCOPIC-ADD ON     Status: Abnormal   Collection Time    01/17/13  7:59 PM      Result Value Range   Squamous Epithelial / LPF MANY (*) RARE   WBC, UA TOO NUMEROUS TO COUNT  <3 WBC/hpf   RBC / HPF 0-2  <3 RBC/hpf   Bacteria, UA MANY (*) RARE   Urine-Other MUCOUS PRESENT     Ct Abdomen Pelvis W Contrast  01/17/2013   CLINICAL DATA:  Upper to mid abdominal pain and nausea.  EXAM: CT ABDOMEN AND PELVIS WITH CONTRAST   TECHNIQUE: Multidetector CT imaging of the abdomen and pelvis was performed using the standard protocol following bolus administration of intravenous contrast.  CONTRAST:  OMNIPAQUE IOHEXOL 300 MG/ML  SOLN  COMPARISON:  CT of the abdomen and pelvis performed 02/24/2005, and abdominal ultrasound performed earlier today at 8:48 p.m.  FINDINGS: The visualized lung bases are clear.  There is mild distention of the gallbladder, with mildly increased wall enhancement and perhaps trace pericholecystic fluid on coronal images. There is associated mild prominence of the intrahepatic biliary ducts, slightly more prominent than in 2007. The common hepatic duct is dilated to 9 mm in maximal diameter. This raises concern for distal obstruction, though no definite stone is seen.  A 6 mm hypodensity within the right hepatic lobe is only minimally changed from 2007 and is likely benign. The liver is otherwise unremarkable in appearance. The spleen is within normal limits. The pancreas and adrenal glands are otherwise unremarkable.  A few small hypodensities are noted within the left kidney, likely reflecting small cysts. The kidneys are otherwise unremarkable in appearance. There is no evidence of hydronephrosis. No renal or ureteral stones are seen. No perinephric stranding is appreciated.  The small bowel is unremarkable in appearance. The stomach is within normal limits. No acute vascular abnormalities are seen.  The appendix is not definitely seen; there is no evidence for appendicitis. The colon is unremarkable in appearance.  The bladder is mildly distended and grossly unremarkable. The uterus is within normal limits. The ovaries are relatively symmetric. No suspicious adnexal masses are seen. A small amount of free fluid within the pelvis is likely physiologic in nature. No inguinal lymphadenopathy is seen.  No acute osseous abnormalities are identified.  IMPRESSION: 1. Mild gallbladder distention, with mildly  increased wall enhancement and perhaps trace pericholecystic fluid. Mild prominence of the intrahepatic biliary  ducts, and dilatation of the common hepatic duct to 9 mm in maximal diameter. This raises concern for distal obstruction, though no definite stone is seen. MRCP or ERCP could be considered for further evaluation. 2. Likely tiny hepatic cyst and small left renal cysts.   Electronically Signed   By: Roanna Raider M.D.   On: 01/17/2013 23:33   US Abdomen Limited  01/17/2013   CLINICAL DATA:  Epigastric pain.  Nausea.  EXAM: US ABDOMEN LIMITED - RIGHT UPPER QUADRANT  COMPARISON:  CT, 02/24/2005  FINDINGS: Gallbladder  Multiple gallstones. No wall thickening or pericholecystic fluid. No evidence of acute cholecystitis.  Common bile duct  Diameter: 12 mm proximally with some distal tapering. No duct stone is seen.  Liver:  Mild central intrahepatic bile duct dilation. Liver is normal in size and overall echogenicity. No mass or focal lesion.  Oval soft tissue mass is seen along the distal posterior gastrohepatic ligament most likely an enlarged lymph node. It measures 3.2 cm x 1.8 cm x 1.9 cm. A smaller, 10.5 cm short axis, node was noted in this location on the prior CT.  IMPRESSION: 1. Intra and extra hepatic bile duct dilation, increased when compared to the prior CT. The most distal aspect the duct was not fully visualized. A distal stone is not excluded. 2. Oval soft tissue mass along the posterior gastrohepatic ligament most likely an enlarged lymph node, larger than it was on the prior CT. 3. Gallstones without evidence of acute cholecystitis. 4. Recommend followup CT of the abdomen with contrast for further evaluation of the gastrohepatic ligament mass/ adenopathy and duct dilation.   Electronically Signed   By: Amie Portland M.D.   On: 01/17/2013 21:47    Review of Systems  Constitutional: Negative for fever and chills.  HENT: Negative.   Eyes: Negative.   Respiratory: Negative.    Cardiovascular: Negative.   Gastrointestinal: Positive for heartburn, nausea and abdominal pain. Negative for vomiting, diarrhea and constipation.  Genitourinary: Negative.   Musculoskeletal: Negative.   Skin: Negative.   Neurological: Negative.   Endo/Heme/Allergies: Negative.   Psychiatric/Behavioral: Negative.     Blood pressure 107/74, pulse 72, temperature 98.2 F (36.8 C), temperature source Oral, resp. rate 20, SpO2 96.00%. Physical Exam  Constitutional: She is oriented to person, place, and time. She appears well-developed and well-nourished. No distress.  HENT:  Head: Normocephalic and atraumatic.  Nose: Nose normal.  Mouth/Throat: Oropharynx is clear and moist. No oropharyngeal exudate.  Eyes: EOM are normal. Pupils are equal, round, and reactive to light. Right eye exhibits no discharge. Left eye exhibits no discharge. No scleral icterus.  Neck: Normal range of motion. Neck supple.  Cardiovascular: Normal rate, regular rhythm, normal heart sounds and intact distal pulses.   No murmur heard. Respiratory: Effort normal and breath sounds normal. No stridor. No respiratory distress. She has no wheezes. She has no rales. She exhibits no tenderness.  GI: Soft. She exhibits no distension. There is tenderness. There is no rebound and no guarding.  Epigastric tenderness without guarding, no generalized tenderness, no peritoneal signs  Musculoskeletal: Normal range of motion. She exhibits no tenderness.  Neurological: She is alert and oriented to person, place, and time. She exhibits normal muscle tone.  Skin: Skin is warm.  Psychiatric: She has a normal mood and affect.     Assessment/Plan Gallstones with dilated common bile duct and elevated liver function tests consistent with choledocholithiasis. Also has mildly elevated lipase. Will admit, give IV fluids, keep n.p.o.,  and recheck labs in the morning. If liver function tests remain elevated, she will need MRCP versus ERCP as  the next step. We'll plan cholecystectomy and cholangiogram this admission provided lipase does not worsen. Plan was discussed in detail with her and her husband. I answered their questions.   Aragorn Recker E 01/18/2013, 12:31 AM

## 2013-01-18 NOTE — Anesthesia Preprocedure Evaluation (Signed)
Anesthesia Evaluation  Patient identified by MRN, date of birth, ID band Patient awake    Reviewed: Allergy & Precautions, H&P , NPO status , Patient's Chart, lab work & pertinent test results  Airway Mallampati: II  Neck ROM: full    Dental   Pulmonary asthma ,          Cardiovascular negative cardio ROS      Neuro/Psych    GI/Hepatic   Endo/Other    Renal/GU      Musculoskeletal   Abdominal   Peds  Hematology   Anesthesia Other Findings   Reproductive/Obstetrics                           Anesthesia Physical Anesthesia Plan  ASA: II  Anesthesia Plan: General   Post-op Pain Management:    Induction: Intravenous  Airway Management Planned: Oral ETT  Additional Equipment:   Intra-op Plan:   Post-operative Plan: Extubation in OR  Informed Consent: I have reviewed the patients History and Physical, chart, labs and discussed the procedure including the risks, benefits and alternatives for the proposed anesthesia with the patient or authorized representative who has indicated his/her understanding and acceptance.     Plan Discussed with: CRNA, Anesthesiologist and Surgeon  Anesthesia Plan Comments:         Anesthesia Quick Evaluation  

## 2013-01-18 NOTE — Progress Notes (Signed)
General Surgery [facility]  Patient seen and examined.  Plan lap chole with IOC today.  Discussed procedure with patient.  Discussed possibility of conversion to open surgery.  The risks and benefits of the procedure have been discussed at length with the patient.  The patient understands the proposed procedure, potential alternative treatments, and the course of recovery to be expected.  All of the patient's questions have been answered at this time.  The patient wishes to proceed with surgery.  Velora Heckler, MD, [facility] Office: [phone]

## 2013-01-18 NOTE — Transfer of Care (Signed)
Immediate Anesthesia Transfer of Care Note  Patient: Grace Tucker  Procedure(s) Performed: Procedure(s): LAPAROSCOPIC CHOLECYSTECTOMY WITH INTRAOPERATIVE CHOLANGIOGRAM (N/A)  Patient Location: PACU  Anesthesia Type:General  Level of Consciousness: sedated  Airway & Oxygen Therapy: Patient Spontanous Breathing  Post-op Assessment: Report given to PACU RN and Post -op Vital signs reviewed and stable  Post vital signs: Reviewed and stable  Complications: No apparent anesthesia complications

## 2013-01-19 MED ORDER — OXYCODONE-ACETAMINOPHEN 5-325 MG PO TABS
1.0000 | ORAL_TABLET | ORAL | Status: DC | PRN
  Administered 2013-01-19: 1 via ORAL
  Filled 2013-01-19: qty 1

## 2013-01-19 MED ORDER — INFLUENZA VAC SPLIT QUAD 0.5 ML IM SUSP: 0.5000 mL | INTRAMUSCULAR | Status: DC

## 2013-01-19 MED ORDER — INFLUENZA VAC SPLIT QUAD 0.5 ML IM SUSP
0.5000 mL | INTRAMUSCULAR | Status: AC | PRN
  Administered 2013-01-19: 0.5 mL via INTRAMUSCULAR

## 2013-01-19 MED ORDER — OXYCODONE-ACETAMINOPHEN 5-325 MG PO TABS: 1.0000 | ORAL_TABLET | ORAL | Status: DC | PRN

## 2013-01-19 NOTE — Progress Notes (Signed)
1 Day Post-Op   Assessment: s/p Procedure(s): LAPAROSCOPIC CHOLECYSTECTOMY WITH INTRAOPERATIVE CHOLANGIOGRAM Patient Active Problem List   Diagnosis Date Noted  . Transaminitis 01/18/2013  . Cholecystitis with cholelithiasis 01/18/2013    Doing well post op, voiding better this am  Plan: Probably discharge after lunch if it is tolerated  Subjective: Feels OK and thinks she is ready to go home this afternoon, wants to be sure she tolerates lunch.  Objective: Vital signs in last 24 hours: Temp:  [97.9 F (36.6 C)-98.7 F (37.1 C)] 98.1 F (36.7 C) (12/13 0617) Pulse Rate:  [50-81] 69 (12/13 0617) Resp:  [14-18] 16 (12/13 0617) BP: (110-161)/(59-90) 110/61 mmHg (12/13 0617) SpO2:  [98 %-100 %] 100 % (12/13 0617)   Intake/Output from previous day: 12/12 0701 - 12/13 0700 In: 2925 [I.V.:2925] Out: 2700 [Urine:2650; Blood:50]  General appearance: alert, cooperative and no distress Resp: clear to auscultation bilaterally GI: Soft, benign  Incision: no significant drainage  Lab Results:   Recent Labs  01/17/13 1810 01/18/13 0610  WBC 7.4 7.2  HGB 14.7 12.1  HCT 42.4 35.0*  PLT 308 257   BMET  Recent Labs  01/17/13 1810 01/18/13 0610  NA 139 137  K 3.5 3.2*  CL 103 107  CO2 25 20  GLUCOSE 98 94  BUN 11 7  CREATININE 0.79 0.71  CALCIUM 9.3 8.0*    MEDS, Scheduled . ciprofloxacin  400 mg Intravenous BID  . pantoprazole (PROTONIX) IV  40 mg Intravenous QHS    Studies/Results: Dg Cholangiogram Operative  01/18/2013   CLINICAL DATA:  Cholelithiasis.  EXAM: INTRAOPERATIVE CHOLANGIOGRAM  TECHNIQUE: Cholangiographic images from the C-arm fluoroscopic device were submitted for interpretation post-operatively. Please see the procedural report for the amount of contrast and the fluoroscopy time utilized.  COMPARISON:  CT scan dated 01/17/2013 and ultrasound dated 01/17/2013  FINDINGS: There is free flow of contrast into the duodenum. The visualized bile ducts  appear normal.  IMPRESSION: Normal intraoperative cholangiogram.   Electronically Signed   By: Geanie Cooley M.D.   On: 01/18/2013 14:22   Ct Abdomen Pelvis W Contrast  01/17/2013   CLINICAL DATA:  Upper to mid abdominal pain and nausea.  EXAM: CT ABDOMEN AND PELVIS WITH CONTRAST  TECHNIQUE: Multidetector CT imaging of the abdomen and pelvis was performed using the standard protocol following bolus administration of intravenous contrast.  CONTRAST:  OMNIPAQUE IOHEXOL 300 MG/ML  SOLN  COMPARISON:  CT of the abdomen and pelvis performed 02/24/2005, and abdominal ultrasound performed earlier today at 8:48 p.m.  FINDINGS: The visualized lung bases are clear.  There is mild distention of the gallbladder, with mildly increased wall enhancement and perhaps trace pericholecystic fluid on coronal images. There is associated mild prominence of the intrahepatic biliary ducts, slightly more prominent than in 2007. The common hepatic duct is dilated to 9 mm in maximal diameter. This raises concern for distal obstruction, though no definite stone is seen.  A 6 mm hypodensity within the right hepatic lobe is only minimally changed from 2007 and is likely benign. The liver is otherwise unremarkable in appearance. The spleen is within normal limits. The pancreas and adrenal glands are otherwise unremarkable.  A few small hypodensities are noted within the left kidney, likely reflecting small cysts. The kidneys are otherwise unremarkable in appearance. There is no evidence of hydronephrosis. No renal or ureteral stones are seen. No perinephric stranding is appreciated.  The small bowel is unremarkable in appearance. The stomach is within normal limits.  No acute vascular abnormalities are seen.  The appendix is not definitely seen; there is no evidence for appendicitis. The colon is unremarkable in appearance.  The bladder is mildly distended and grossly unremarkable. The uterus is within normal limits. The ovaries are  relatively symmetric. No suspicious adnexal masses are seen. A small amount of free fluid within the pelvis is likely physiologic in nature. No inguinal lymphadenopathy is seen.  No acute osseous abnormalities are identified.  IMPRESSION: 1. Mild gallbladder distention, with mildly increased wall enhancement and perhaps trace pericholecystic fluid. Mild prominence of the intrahepatic biliary ducts, and dilatation of the common hepatic duct to 9 mm in maximal diameter. This raises concern for distal obstruction, though no definite stone is seen. MRCP or ERCP could be considered for further evaluation. 2. Likely tiny hepatic cyst and small left renal cysts.   Electronically Signed   By: Roanna Raider M.D.   On: 01/17/2013 23:33   US Abdomen Limited  01/17/2013   CLINICAL DATA:  Epigastric pain.  Nausea.  EXAM: US ABDOMEN LIMITED - RIGHT UPPER QUADRANT  COMPARISON:  CT, 02/24/2005  FINDINGS: Gallbladder  Multiple gallstones. No wall thickening or pericholecystic fluid. No evidence of acute cholecystitis.  Common bile duct  Diameter: 12 mm proximally with some distal tapering. No duct stone is seen.  Liver:  Mild central intrahepatic bile duct dilation. Liver is normal in size and overall echogenicity. No mass or focal lesion.  Oval soft tissue mass is seen along the distal posterior gastrohepatic ligament most likely an enlarged lymph node. It measures 3.2 cm x 1.8 cm x 1.9 cm. A smaller, 10.5 cm short axis, node was noted in this location on the prior CT.  IMPRESSION: 1. Intra and extra hepatic bile duct dilation, increased when compared to the prior CT. The most distal aspect the duct was not fully visualized. A distal stone is not excluded. 2. Oval soft tissue mass along the posterior gastrohepatic ligament most likely an enlarged lymph node, larger than it was on the prior CT. 3. Gallstones without evidence of acute cholecystitis. 4. Recommend followup CT of the abdomen with contrast for further evaluation of  the gastrohepatic ligament mass/ adenopathy and duct dilation.   Electronically Signed   By: Amie Portland M.D.   On: 01/17/2013 21:47      LOS: 2 days     Currie Paris, MD, [facility], [place] [phone]   01/19/2013 9:53 AM

## 2013-01-19 NOTE — Care Management Utilization Note (Signed)
UR complete    Binh Doten,MSN,RN [phone] 

## 2013-01-19 NOTE — Progress Notes (Signed)
Discharge home. Home discharge instruction given, no questions verbalized. 

## 2013-01-21 ENCOUNTER — Telehealth (INDEPENDENT_AMBULATORY_CARE_PROVIDER_SITE_OTHER): Payer: Self-pay

## 2013-01-21 NOTE — Telephone Encounter (Signed)
Message copied by Joanette Gula on Mon Jan 21, 2013 12:07 PM ------      Message from: [facility]      Created: Mon Jan 21, 2013 11:31 AM      Contact: [phone]       Please call her she had surgery on 01/17/2013 and she needs to know when she can go back to work. ------

## 2013-01-21 NOTE — Telephone Encounter (Signed)
LMOM for pt to call. Pt can return to work 01-28-13 without restriction.

## 2013-01-21 NOTE — Anesthesia Postprocedure Evaluation (Signed)
Anesthesia Post Note  Patient: Grace Tucker  Procedure(s) Performed: Procedure(s) (LRB): LAPAROSCOPIC CHOLECYSTECTOMY WITH INTRAOPERATIVE CHOLANGIOGRAM (N/A)  Anesthesia type: general  Patient location: PACU  Post pain: Pain level controlled  Post assessment: Patient's Cardiovascular Status Stable  Post vital signs: Reviewed and stable  Level of consciousness: sedated  Complications: No apparent anesthesia complications

## 2013-01-21 NOTE — Discharge Summary (Signed)
Physician Discharge Summary  Patient ID: Grace Tucker [facility] MRN: [id number] DOB/AGE: February 24, 1960 52 y.o.  Admit date: 01/17/2013 Discharge date: 01/21/2013  Admitting Diagnosis: Cholecystitis with cholelithiasis Transaminitis  Discharge Diagnosis Patient Active Problem List   Diagnosis Date Noted  . Transaminitis 01/18/2013  . Cholecystitis with cholelithiasis 01/18/2013    Consultants None  Imaging: No results found.  Procedures Dr. Gerrit Friends (01/18/13) - Laparoscopic Cholecystectomy with [facility]  Hospital Course:  52 y/o female presented to [facility] with a four-day history of epigastric pain and associated nausea. She has not vomited. She has had very limited oral intake during this time period. The pain comes and goes. It does not seem exacerbated by any factors. Evaluation in the ED demonstrated gallstones and elevated liver function tests.   Patient was admitted and her LFT's repeated the following day.  The LFT's showed a downward trend thus she was scheduled to undergo the procedure listed above.  Tolerated procedure well and was transferred to the floor.  Diet was advanced as tolerated.  On POD #1, the patient was voiding well, tolerating diet, ambulating well, pain well controlled, vital signs stable, incisions c/d/i and felt stable for discharge home.  Patient will follow up in our office in 2-3 weeks and knows to call with questions or concerns.      Medication List         acetaminophen 500 MG tablet  Commonly known as:  TYLENOL  Take 1,000 mg by mouth every 6 (six) hours as needed for mild pain.     bismuth subsalicylate 262 MG chewable tablet  Commonly known as:  PEPTO BISMOL  Chew 524 mg by mouth daily as needed for indigestion.     ciprofloxacin 500 MG tablet  Commonly known as:  CIPRO  Take 500 mg by mouth 2 (two) times daily. For 7 days.     ibuprofen 200 MG tablet  Commonly known as:  ADVIL,MOTRIN  Take 200 mg by mouth every 6 (six) hours as needed  for moderate pain.     omeprazole 40 MG capsule  Commonly known as:  PRILOSEC  Take 40 mg by mouth at bedtime.     ondansetron 4 MG disintegrating tablet  Commonly known as:  ZOFRAN-ODT  Take 4 mg by mouth every 6 (six) hours as needed for nausea or vomiting. For 3 days     oxyCODONE-acetaminophen 5-325 MG per tablet  Commonly known as:  PERCOCET/ROXICET  Take 1 tablet by mouth every 4 (four) hours as needed for moderate pain (As needed for moderate pain).     ranitidine 150 MG tablet  Commonly known as:  ZANTAC  Take 150 mg by mouth daily.             Follow-up Information   Follow up with [facility]. Schedule an appointment as soon as possible for a visit in 3 weeks. [phone])    Contact information:   [address]   [place] [address] [phone]       Signed: Candiss Norse [facility] [phone]  01/21/2013, 1:26 PM

## 2013-01-22 ENCOUNTER — Encounter (HOSPITAL_COMMUNITY): Payer: Self-pay | Admitting: Surgery

## 2013-02-12 ENCOUNTER — Ambulatory Visit (INDEPENDENT_AMBULATORY_CARE_PROVIDER_SITE_OTHER): Payer: BC Managed Care – PPO | Admitting: General Surgery

## 2013-02-12 ENCOUNTER — Encounter (INDEPENDENT_AMBULATORY_CARE_PROVIDER_SITE_OTHER): Payer: Self-pay

## 2013-02-12 VITALS — BP 120/76 | HR 68 | Temp 99.0°F | Resp 14 | Ht 65.0 in | Wt 174.8 lb

## 2013-02-12 DIAGNOSIS — R7402 Elevation of levels of lactic acid dehydrogenase (LDH): Secondary | ICD-10-CM

## 2013-02-12 DIAGNOSIS — R7401 Elevation of levels of liver transaminase levels: Secondary | ICD-10-CM

## 2013-02-12 DIAGNOSIS — R740 Nonspecific elevation of levels of transaminase and lactic acid dehydrogenase [LDH]: Secondary | ICD-10-CM

## 2013-02-12 DIAGNOSIS — Z9889 Other specified postprocedural states: Secondary | ICD-10-CM

## 2013-02-12 DIAGNOSIS — Z9049 Acquired absence of other specified parts of digestive tract: Secondary | ICD-10-CM

## 2013-02-12 DIAGNOSIS — K8010 Calculus of gallbladder with chronic cholecystitis without obstruction: Secondary | ICD-10-CM

## 2013-02-12 NOTE — Progress Notes (Signed)
  Subjective: Grace Tucker is a 53 y.o. female who had a laparoscopic cholecystectomy  on 01/19/11 by Dr. Gerrit FriendsGerkin returns to the clinic today.  Pathology reveals chronic cholecystitis and cholelithiasis.  The patient is tolerating her soft diet okay, but really hasn't progressed back to all regular foods yet.  Some mild nausea.  Not feeling a 100% yet.  She is having no severe pain, not requiring any pain medications.  Bowel function is good other than some constipation.  She is taking miralax.  The pre-operative symptoms of abdominal pain, nausea, and vomiting have resolved.  No problems with the wounds.  Pt is returning to normal activity / work.   Objective: Vital signs in last 24 hours: Reviewed   PE: General:  Alert, NAD, pleasant Abdomen:  soft, NT/ND, +bs, incisions appear well-healed with no sign of infection or bleeding   Assessment/Plan  1.  S/P Laparoscopic Cholecystectomy: doing well, may resume regular activity without restrictions, Pt will follow up with us PRN and knows to call with questions or concerns.   2.  Constipation - encourage increased water intake,  3.  Low appetite - add protein and pureed foods to try to get appetite jump started.  If not improving over the next week may need additional workup, but I am re-assured that she doesn't have any abdominal pain.  Have urged her with close follow up as needed.   Aris GeorgiaDORT, Maciah Feeback, PA-C 02/12/2013

## 2013-02-12 NOTE — Patient Instructions (Signed)
She may resume a regular diet as able and add protein supplements, return to full activity slowly.  She may follow-up on a PRN basis.  Establish with a primary care provider in case additional follow up is needed.

## 2014-09-19 IMAGING — CT CT ABD-PELV W/ CM
2 of 5 series · 15 of 46 positions shown, 17 images · IV contrast (APPLIED)
Comparison: CT of the abdomen and pelvis performed 02/24/2005, and
abdominal ultrasound performed earlier today at [DATE] p.m.

CLINICAL DATA: Upper to mid abdominal pain and nausea.

EXAM:
CT ABDOMEN AND PELVIS WITH CONTRAST
TECHNIQUE: Multidetector CT imaging of the abdomen and pelvis was performed
using the standard protocol following bolus administration of
intravenous contrast.
CONTRAST:  100mL OMNIPAQUE IOHEXOL 300 MG/ML  SOLN

[Series 2: abd/ pelvis 5.0 i30f 1 · axial · 0.78mm/px · z∈[-473,-38]mm · 12 of 99 slices shown, 14 images]
[im 6/99  soft-tissue]
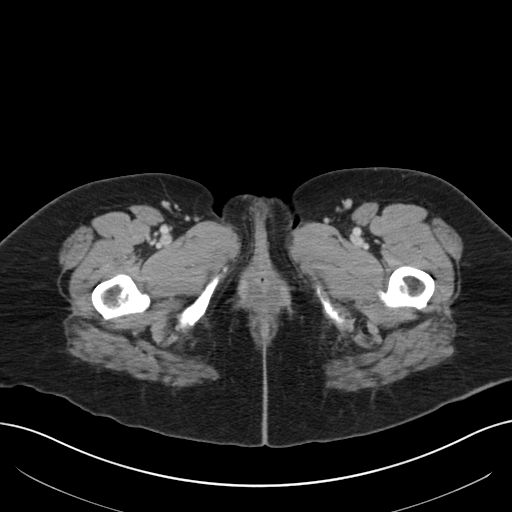
[im 6/99  bone]
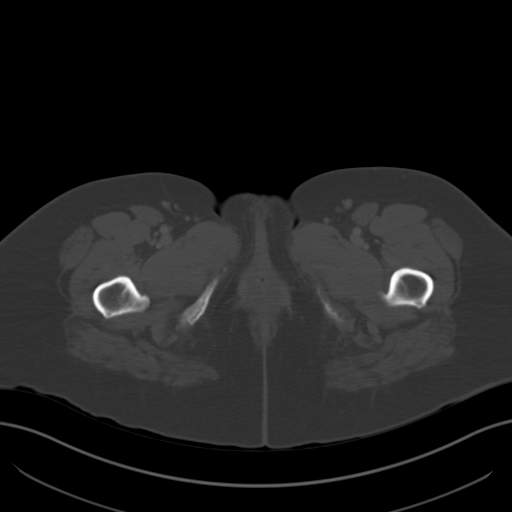
[im 16/99  soft-tissue]
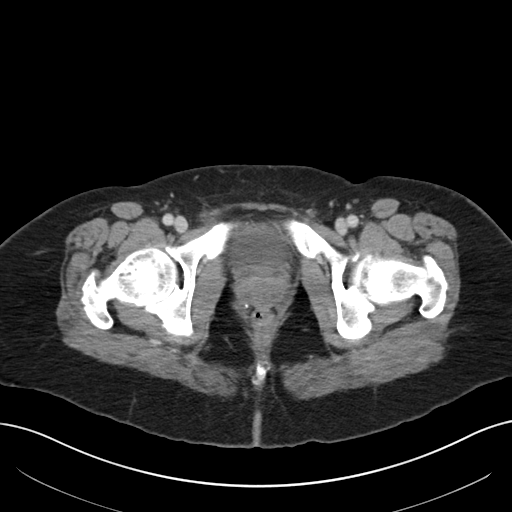
[im 21/99  soft-tissue]
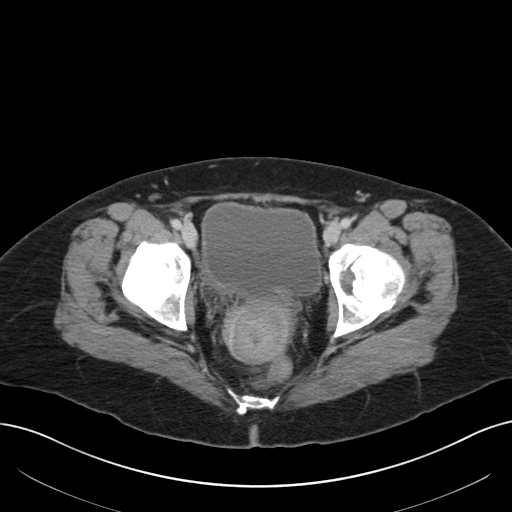
[im 31/99  soft-tissue]
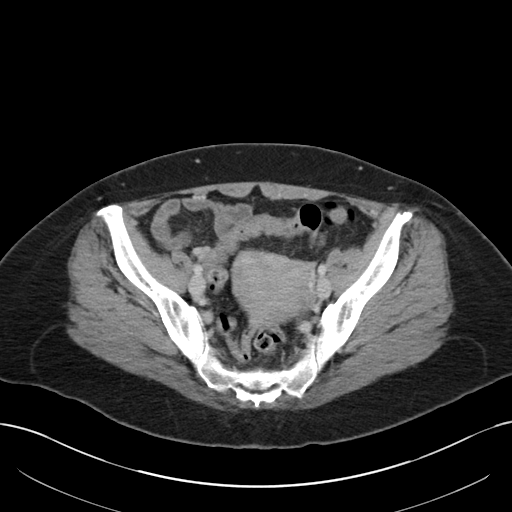
[im 37/99  soft-tissue]
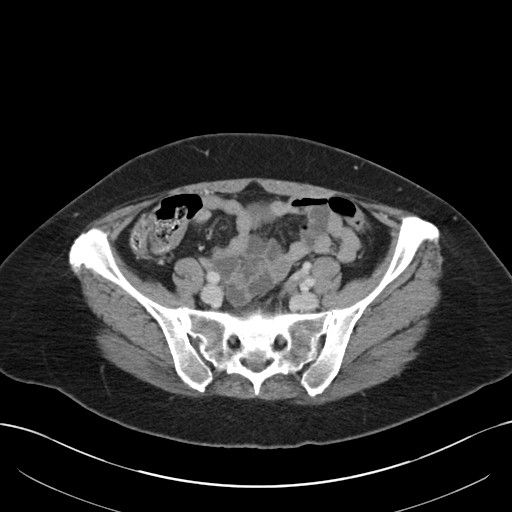
[im 47/99  soft-tissue]
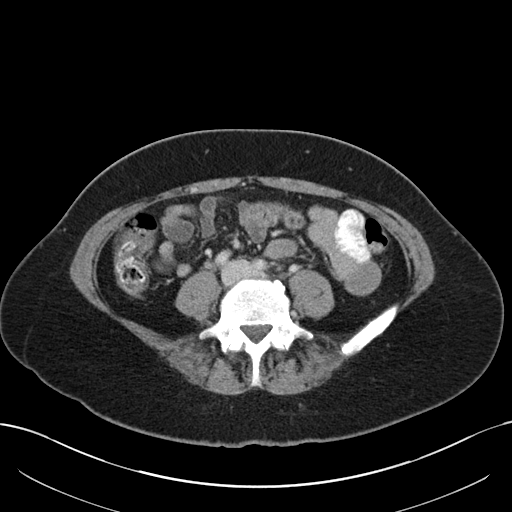
[im 52/99  soft-tissue]
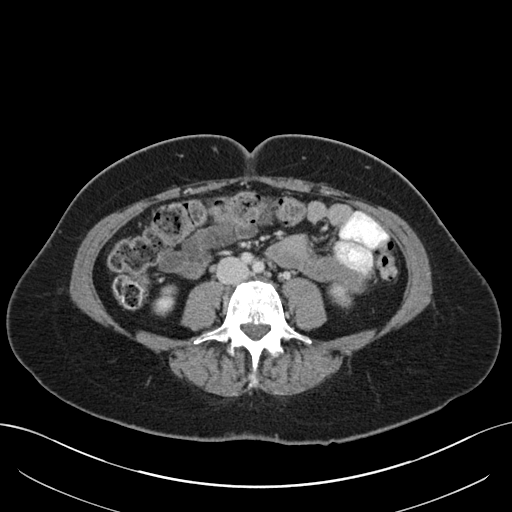
[im 62/99  soft-tissue]
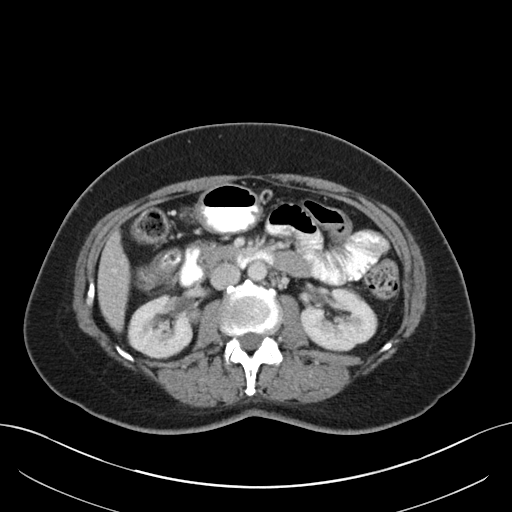
[im 68/99  soft-tissue]
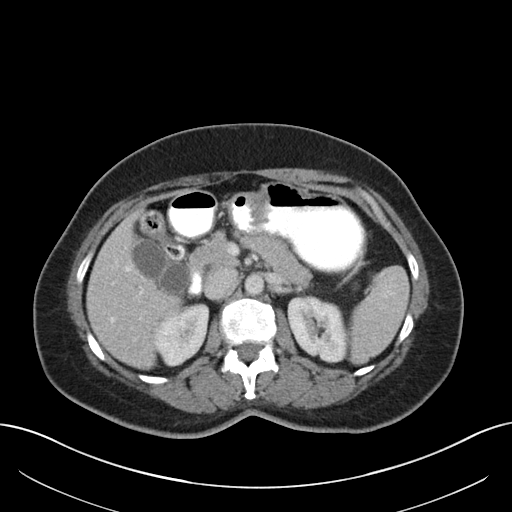
[im 68/99  bone]
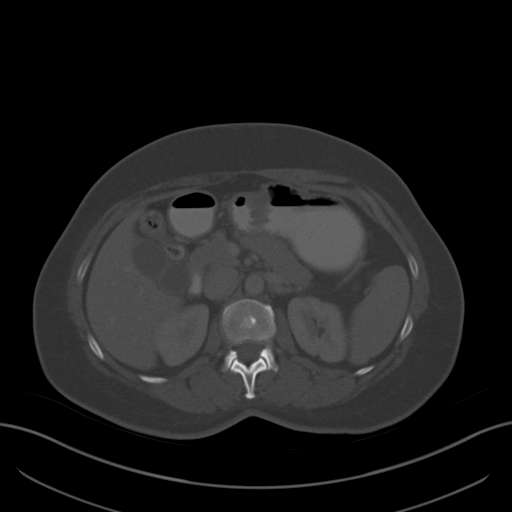
[im 78/99  soft-tissue]
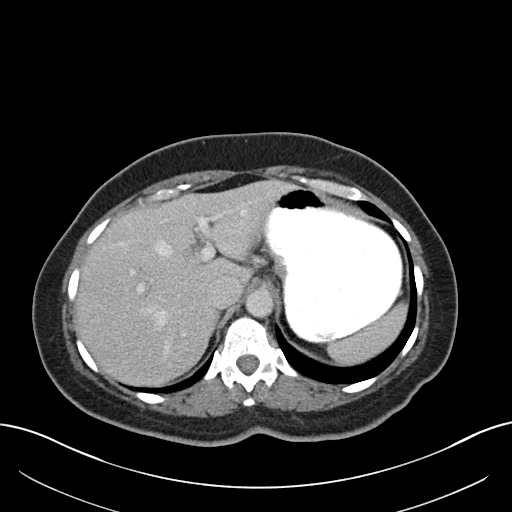
[im 83/99  soft-tissue]
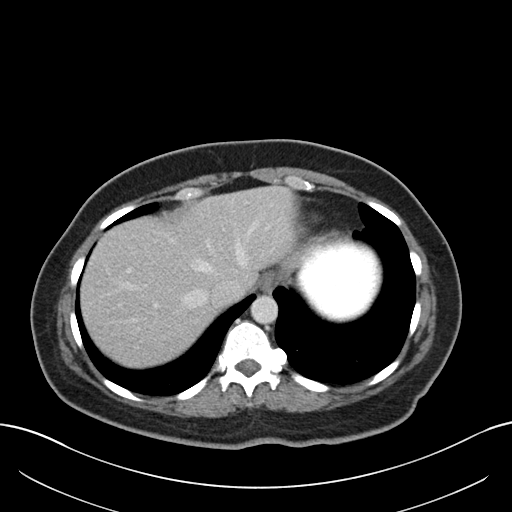
[im 93/99  soft-tissue]
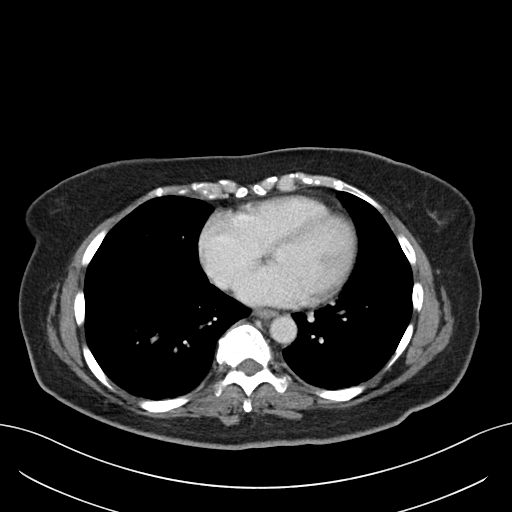

[Series 5: cor · coronal · 0.75mm/px · 3 of 113 slices shown]
[im 38/113  soft-tissue]
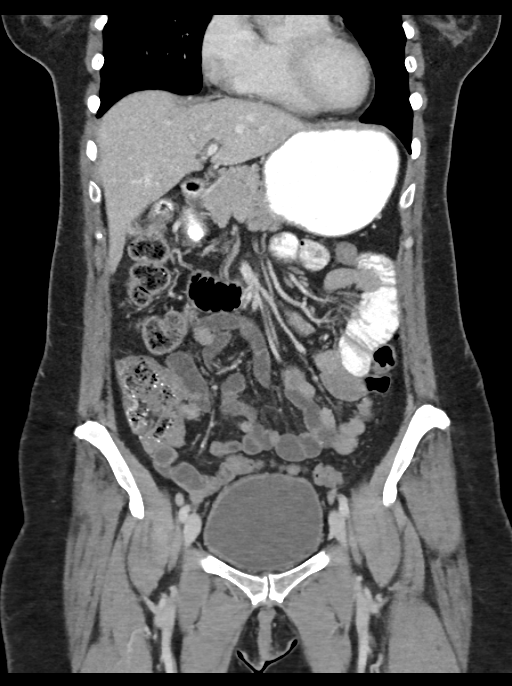
[im 50/113  soft-tissue]
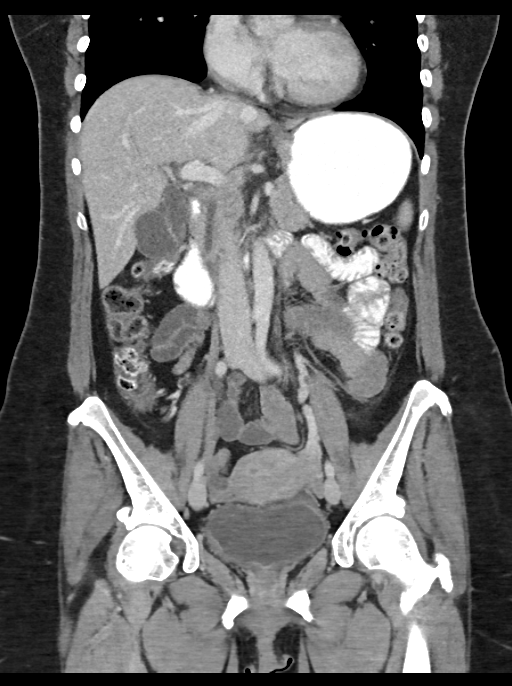
[im 63/113  soft-tissue]
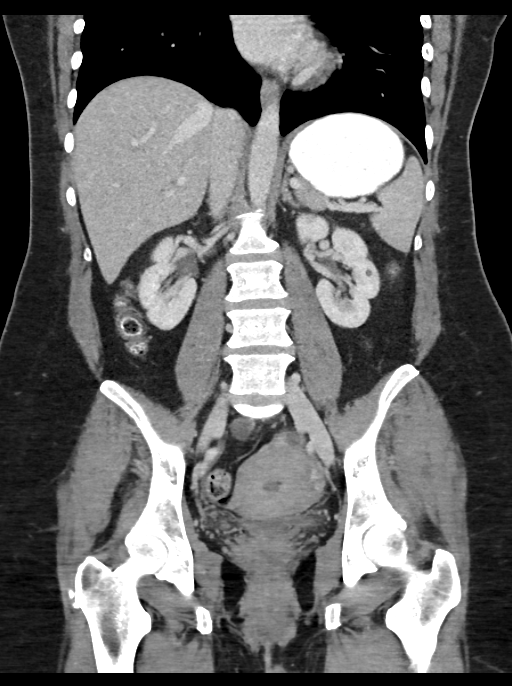

[15 of 46 positions shown; findings below may reference images not displayed]

FINDINGS: The visualized lung bases are clear.

There is mild distention of the gallbladder, with mildly increased
wall enhancement and perhaps trace pericholecystic fluid on coronal
images. There is associated mild prominence of the intrahepatic
biliary ducts, slightly more prominent than in 7997. The common
hepatic duct is dilated to 9 mm in maximal diameter. This raises
concern for distal obstruction, though no definite stone is seen.

A 6 mm hypodensity within the right hepatic lobe is only minimally
changed from 7997 and is likely benign. The liver is otherwise
unremarkable in appearance. The spleen is within normal limits. The
pancreas and adrenal glands are otherwise unremarkable.

A few small hypodensities are noted within the left kidney, likely
reflecting small cysts. The kidneys are otherwise unremarkable in
appearance. There is no evidence of hydronephrosis. No renal or
ureteral stones are seen. No perinephric stranding is appreciated.

The small bowel is unremarkable in appearance. The stomach is within
normal limits. No acute vascular abnormalities are seen.

The appendix is not definitely seen; there is no evidence for
appendicitis. The colon is unremarkable in appearance.

The bladder is mildly distended and grossly unremarkable. The uterus
is within normal limits. The ovaries are relatively symmetric. No
suspicious adnexal masses are seen. A small amount of free fluid
within the pelvis is likely physiologic in nature. No inguinal
lymphadenopathy is seen.

No acute osseous abnormalities are identified.
IMPRESSION: 1. Mild gallbladder distention, with mildly increased wall
enhancement and perhaps trace pericholecystic fluid. Mild prominence
of the intrahepatic biliary ducts, and dilatation of the common
hepatic duct to 9 mm in maximal diameter. This raises concern for
distal obstruction, though no definite stone is seen. MRCP or ERCP
could be considered for further evaluation.
2. Likely tiny hepatic cyst and small left renal cysts.

## 2015-05-15 ENCOUNTER — Encounter (HOSPITAL_COMMUNITY): Payer: Self-pay | Admitting: Family Medicine

## 2015-05-15 ENCOUNTER — Observation Stay (HOSPITAL_COMMUNITY)
Admission: EM | Admit: 2015-05-15 | Discharge: 2015-05-16 | Disposition: A | Discharge status: Home / Self Care | Payer: BLUE CROSS/BLUE SHIELD | Attending: Internal Medicine | Admitting: Internal Medicine

## 2015-05-15 DIAGNOSIS — R55 Syncope and collapse: Secondary | ICD-10-CM | POA: Diagnosis not present

## 2015-05-15 DIAGNOSIS — J449 Chronic obstructive pulmonary disease, unspecified: Secondary | ICD-10-CM | POA: Insufficient documentation

## 2015-05-15 DIAGNOSIS — J45909 Unspecified asthma, uncomplicated: Secondary | ICD-10-CM | POA: Diagnosis present

## 2015-05-15 DIAGNOSIS — R739 Hyperglycemia, unspecified: Secondary | ICD-10-CM | POA: Insufficient documentation

## 2015-05-15 DIAGNOSIS — I471 Supraventricular tachycardia, unspecified: Secondary | ICD-10-CM | POA: Diagnosis present

## 2015-05-15 DIAGNOSIS — E876 Hypokalemia: Secondary | ICD-10-CM | POA: Diagnosis present

## 2015-05-15 DIAGNOSIS — I483 Typical atrial flutter: Secondary | ICD-10-CM

## 2015-05-15 HISTORY — DX: Syncope and collapse: R55

## 2015-05-15 LAB — CBC
HCT: 38.4 % (ref 36.0–46.0)
HEMOGLOBIN: 12.9 g/dL (ref 12.0–15.0)
MCH: 30.1 pg (ref 26.0–34.0)
MCHC: 33.6 g/dL (ref 30.0–36.0)
MCV: 89.7 fL (ref 78.0–100.0)
Platelets: 294 10*3/uL (ref 150–400)
RBC: 4.28 MIL/uL (ref 3.87–5.11)
RDW: 12.3 % (ref 11.5–15.5)
WBC: 6.7 10*3/uL (ref 4.0–10.5)

## 2015-05-15 LAB — BASIC METABOLIC PANEL
ANION GAP: 10 (ref 5–15)
BUN: 17 mg/dL (ref 6–20)
CO2: 24 mmol/L (ref 22–32)
Calcium: 9.5 mg/dL (ref 8.9–10.3)
Chloride: 109 mmol/L (ref 101–111)
Creatinine, Ser: 0.91 mg/dL (ref 0.44–1.00)
GFR calc Af Amer: >60 mL/min (ref >60)
GFR calc non Af Amer: >60 mL/min (ref >60)
GLUCOSE: 123 mg/dL — AB (ref 65–99)
Potassium: 3.4 mmol/L — ABNORMAL LOW (ref 3.5–5.1)
Sodium: 143 mmol/L (ref 135–145)

## 2015-05-15 LAB — URINALYSIS, ROUTINE W REFLEX MICROSCOPIC
Bilirubin Urine: NEGATIVE
GLUCOSE, UA: NEGATIVE mg/dL
HGB URINE DIPSTICK: NEGATIVE
Ketones, ur: NEGATIVE mg/dL
Leukocytes, UA: NEGATIVE
Nitrite: NEGATIVE
Protein, ur: NEGATIVE mg/dL
SPECIFIC GRAVITY, URINE: 1.011 (ref 1.005–1.030)
pH: 7.5 (ref 5.0–8.0)

## 2015-05-15 LAB — PHOSPHORUS: Phosphorus: 2.3 mg/dL — ABNORMAL LOW (ref 2.5–4.6)

## 2015-05-15 LAB — MAGNESIUM: Magnesium: 2.1 mg/dL (ref 1.7–2.4)

## 2015-05-15 LAB — T4, FREE: Free T4: 1.06 ng/dL (ref 0.61–1.12)

## 2015-05-15 LAB — I-STAT TROPONIN, ED: Troponin i, poc: 0 ng/mL (ref 0.00–0.08)

## 2015-05-15 LAB — TROPONIN I

## 2015-05-15 LAB — TSH: TSH: 3.595 u[IU]/mL (ref 0.350–4.500)

## 2015-05-15 MED ORDER — IBUPROFEN 200 MG PO TABS: 200.0000 mg | ORAL_TABLET | Freq: Four times a day (QID) | ORAL | Status: DC | PRN

## 2015-05-15 MED ORDER — K PHOS MONO-SOD PHOS DI & MONO 155-852-130 MG PO TABS
500.0000 mg | ORAL_TABLET | Freq: Two times a day (BID) | ORAL | Status: AC
  Administered 2015-05-15 – 2015-05-16 (×2): 500 mg via ORAL
  Filled 2015-05-15 (×2): qty 2

## 2015-05-15 MED ORDER — SODIUM CHLORIDE 0.9 % IV BOLUS (SEPSIS)
1000.0000 mL | Freq: Once | INTRAVENOUS | Status: AC
  Administered 2015-05-15: 1000 mL via INTRAVENOUS

## 2015-05-15 MED ORDER — ENOXAPARIN SODIUM 40 MG/0.4ML ~~LOC~~ SOLN
40.0000 mg | SUBCUTANEOUS | Status: DC
  Administered 2015-05-15: 40 mg via SUBCUTANEOUS
  Filled 2015-05-15: qty 0.4

## 2015-05-15 MED ORDER — SODIUM CHLORIDE 0.9% FLUSH
3.0000 mL | Freq: Two times a day (BID) | INTRAVENOUS | Status: DC
  Administered 2015-05-15 – 2015-05-16 (×2): 3 mL via INTRAVENOUS

## 2015-05-15 MED ORDER — METOPROLOL TARTRATE 1 MG/ML IV SOLN
5.0000 mg | Freq: Once | INTRAVENOUS | Status: AC
  Administered 2015-05-15: 5 mg via INTRAVENOUS
  Filled 2015-05-15: qty 5

## 2015-05-15 MED ORDER — POTASSIUM CHLORIDE IN NACL 40-0.9 MEQ/L-% IV SOLN
INTRAVENOUS | Status: DC
  Administered 2015-05-15: 250 mL/h via INTRAVENOUS
  Filled 2015-05-15: qty 1000

## 2015-05-15 MED ORDER — ACETAMINOPHEN 500 MG PO TABS
1000.0000 mg | ORAL_TABLET | Freq: Four times a day (QID) | ORAL | Status: DC | PRN
  Administered 2015-05-16: 1000 mg via ORAL
  Filled 2015-05-15: qty 2

## 2015-05-15 MED ORDER — METOPROLOL SUCCINATE ER 25 MG PO TB24
25.0000 mg | ORAL_TABLET | Freq: Every day | ORAL | Status: DC
  Administered 2015-05-15 – 2015-05-16 (×2): 25 mg via ORAL
  Filled 2015-05-15 (×2): qty 1

## 2015-05-15 MED ORDER — MAGNESIUM SULFATE 2 GM/50ML IV SOLN
2.0000 g | Freq: Once | INTRAVENOUS | Status: AC
Start: 2015-05-15 — End: 2015-05-15
  Administered 2015-05-15: 2 g via INTRAVENOUS
  Filled 2015-05-15: qty 50

## 2015-05-15 MED ORDER — POTASSIUM CHLORIDE 20 MEQ PO PACK
20.0000 meq | PACK | Freq: Once | ORAL | Status: AC
  Administered 2015-05-15: 20 meq via ORAL
  Filled 2015-05-15: qty 1

## 2015-05-15 NOTE — Progress Notes (Signed)
Patient lying in bed, no needs expressed at this time. Call light within reach 

## 2015-05-15 NOTE — ED Notes (Signed)
Pt here for syncopal episode at [employer] today. sts she was walking and had LOC and went to the floor. sts she didn't sleep well last night and has been under a lot of stress the past few weeks. sts weak all over and dizzy and tingling all over.

## 2015-05-15 NOTE — ED Notes (Signed)
Pt. Placed in the room and undressed put a gown on.  Pt. Placed on the monitor.

## 2015-05-15 NOTE — H&P (Signed)
[facility] History and Physical  Grace Tucker WUJ:[id number] DOB: 25-Nov-1960 DOA: 05/15/2015  Referring physician: Kathe BectonLori S Berdik, RN PCP: No primary care provider on file.   Chief Complaint: LOC.  HPI: Grace Tucker is a 55 y.o. female with a past medical history of asthma who came to the emergency department after losing consciousness while shopping at [employer].  Per patient, she was she was tired this morning because she was only able to sleep one hour last night. She has been under a lot of stress recently because of numerous reasons, including closing on a house next week and she is also expecting the first visit of her grandson who is coming from [place] with the patient's son. She went shopping with her husband for a baby car seat. While checking one of the offer car seat products, she started feeling palpitations, lightheadedness, followed by weakness and then collapsed. Her husband states that she was probably passed out for a minute or so, but then she had 2 or 3 more similar similar episodes over the next 30 minutes. She denies chest pain, diaphoresis, dyspnea, edema of lower extremities, PND or orthopnea. She denies calf pain, alcohol, tobacco or recreational drug use.  In the ER, the patient had several runs of tachycardia at 180s. This was treated successfully with 2 doses of metoprolol 5 mg IVP. She is being admitted for syncope workup and electrolyte replacement.   Review of Systems:  Constitutional:  No weight loss, night sweats, Fevers, chills, fatigue.  HEENT:  No headaches, Difficulty swallowing,Tooth/dental problems,Sore throat,  No sneezing, itching, ear ache, nasal congestion, post nasal drip,  Cardio-vascular:  As above mentioned. GI:  No heartburn, indigestion, abdominal pain, nausea, vomiting, diarrhea, change in bowel habits, loss of appetite  Resp:  No shortness of breath with exertion or at rest. No excess mucus, no  productive cough, No non-productive cough, No coughing up of blood.No change in color of mucus.No wheezing.No chest wall deformity  Skin:  no rash or lesions.  GU:  no dysuria, change in color of urine, no urgency or frequency. No flank pain.  Musculoskeletal:  No joint pain or swelling. No decreased range of motion. No back pain.  Psych:  Positive insomnia last night and increased stress level over the past 2-3 weeks. No change in mood or affect. No depression or anxiety. No memory loss.   Past Medical History  Diagnosis Date  . Asthma        Past Surgical History  Procedure Laterality Date  . Appendectomy    . Cholecystectomy N/A 01/18/2013    Procedure: LAPAROSCOPIC CHOLECYSTECTOMY WITH INTRAOPERATIVE CHOLANGIOGRAM;  Surgeon: Velora Hecklerodd M Gerkin, MD;  [facility];  Service: General;  Laterality: N/A;   Social History:  reports that she has never smoked. She does not have any smokeless tobacco history on file. She reports that she does not drink alcohol or use illicit drugs.  Allergies  Allergen Reactions  . Ceclor [Cefaclor] Nausea And Vomiting    Family History  Problem Relation Age of Onset  . Cancer Mother     colon    Prior to Admission medications   Medication Sig Start Date End Date Taking? Authorizing Provider  acetaminophen (TYLENOL) 500 MG tablet Take 1,000 mg by mouth every 6 (six) hours as needed for mild pain.   Yes Historical Provider, MD  ibuprofen (ADVIL,MOTRIN) 200 MG tablet Take 200 mg by mouth every 6 (six) hours as needed for moderate pain.   Yes  Historical Provider, MD   Physical Exam: Filed Vitals:   05/15/15 1715 05/15/15 1726 05/15/15 1745 05/15/15 1919  BP: 113/80  119/95 123/90  Pulse: 58  64 67  Temp:    98 F (36.7 C)  TempSrc:    Oral  Resp: Height:   (1.651 m)   (1.651 m)  Weight:  80.74 kg (178 lb)  81.602 kg (179 lb 14.4 oz)  SpO2: 100%  100% 100%    Wt Readings from Last 3 Encounters:  05/15/15 81.602 kg (179  lb 14.4 oz)  02/12/13 79.289 kg (174 lb 12.8 oz)  01/18/13 82.963 kg (182 lb 14.4 oz)    General:  Appears calm and comfortable Eyes: PERRL, normal lids, irises & conjunctiva ENT: grossly normal hearing, lips & tongue Neck: no LAD, masses or thyromegaly Cardiovascular: RRR,Occasional extrasystole, no m/r/g. No LE edema. Telemetry: SR, Occasional PACs Respiratory: CTA bilaterally, no w/r/r. Normal respiratory effort. Abdomen: soft, ntnd Skin: no rash or induration seen on limited exam Musculoskeletal: grossly normal tone BUE/BLE Psychiatric: grossly normal mood and affect, speech fluent and appropriate Neurologic: Awake, alert, oriented 4, grossly non-focal.          Labs on Admission:  Basic Metabolic Panel:  Recent Labs Lab 05/15/15 1453 05/15/15 1741 05/15/15 1909  NA 143  --   --   K 3.4*  --   --   CL 109  --   --   CO2 24  --   --   GLUCOSE 123*  --   --   BUN 17  --   --   CREATININE 0.91  --   --   CALCIUM 9.5  --   --   MG  --  2.1  --   PHOS  --   --  2.3*   CBC:  Recent Labs Lab 05/15/15 1453  WBC 6.7  HGB 12.9  HCT 38.4  MCV 89.7  PLT 294     EKG: Independently reviewed. Vent. rate 90 BPM PR interval 158 ms QRS duration 74 ms QT/QTc 338/413 ms P-R-T axes 75 76 65 Sinus rhythm with Premature atrial complexes Possible Left atrial enlargement Nonspecific ST abnormality Abnormal ECG  Assessment/Plan Principal Problem:   SVT (supraventricular tachycardia) (HCC) Admit to telemetry/ulceration. Continue cardiac monitoring. Cycle troponin levels  IV hydration. Optimize magnesium and replace potassium.  Toprol-XL 25 mg by mouth daily Check echocardiogram and carotid Dopplers.  Active Problems:   Hypokalemia Replacing.    Hypophosphatemia Replaced.    Asthma Asymptomatic. I discussed with the patient the use of Xopenex instead of albuterol.    Code Status: Full Code. DVT Prophylaxis: Lovenox SQ. Family Communication: Her  husband is present in the room. Disposition Plan: Admit for further evaluation and treatment.  Time spent: About 70 minutes were spent in the presence of this admission.  Bobette Mo, M.D. [facility] Pager [phone].

## 2015-05-15 NOTE — ED Provider Notes (Signed)
CSN: [id number]     Arrival date & time 05/15/15  1437 History   First MD Initiated Contact with Patient 05/15/15 1604     Chief Complaint  Patient presents with  . Loss of Consciousness     (Consider location/radiation/quality/duration/timing/severity/associated sxs/prior Treatment) HPI  this is a 55 year old female who presents with a chief complaint of syncopal episode. She reports that she has been feeling intermittently "funny " in her chest over the course of the day. She says that while shopping at [employer] this morning , approximately an hour prior to arrival she suddenly felt weak, had a fluttering in her chest, and collapsed. She briefly lost consciousness. She her husband says this happened 2 or 3 more times over the course of the next 20 or 30 minutes. She comes the ED emergency department for evaluation. She says she is still having these episodes of feeling "funny" in her chest. She has had no chest pain. She has had no cough or shortness of breath. She denies having had any recent prolonged immobilization, leg pain, or hemoptysis.  She has no history of blood clots.  She has no history of coronary artery disease , arrhythmias, or other heart problems.  Past Medical History  Diagnosis Date  . Asthma   . COPD (chronic obstructive pulmonary disease) [facility])    Past Surgical History  Procedure Laterality Date  . Appendectomy    . Cholecystectomy N/A 01/18/2013    Procedure: LAPAROSCOPIC CHOLECYSTECTOMY WITH INTRAOPERATIVE CHOLANGIOGRAM;  Surgeon: Velora Heckler, MD;  Location: [facility] OR;  Service: General;  Laterality: N/A;   Family History  Problem Relation Age of Onset  . Cancer Mother     colon   Social History  Substance Use Topics  . Smoking status: Never Smoker   . Smokeless tobacco: None  . Alcohol Use: No   OB History    No data available     Review of Systems  Constitutional: Negative for fever and fatigue.  Respiratory: Positive for chest tightness. Negative for  shortness of breath and wheezing.   Cardiovascular: Positive for palpitations. Negative for chest pain and leg swelling.  Genitourinary: Negative for dysuria and enuresis.  Neurological: Positive for syncope.  All other systems reviewed and are negative.     Allergies  Ceclor  Home Medications   Prior to Admission medications   Medication Sig Start Date End Date Taking? Authorizing Provider  acetaminophen (TYLENOL) 500 MG tablet Take 1,000 mg by mouth every 6 (six) hours as needed for mild pain.   Yes Historical Provider, MD  ibuprofen (ADVIL,MOTRIN) 200 MG tablet Take 200 mg by mouth every 6 (six) hours as needed for moderate pain.   Yes Historical Provider, MD   BP 111/78 mmHg  Pulse 85  Temp(Src) 97.6 F (36.4 C) (Oral)  Resp 18  SpO2 98% Physical Exam  Constitutional: She is oriented to person, place, and time. She appears well-developed. No distress.  HENT:  Head: Normocephalic and atraumatic.  Eyes: Pupils are equal, round, and reactive to light.  Neck: No JVD present. No thyromegaly present.  Cardiovascular: Normal rate, regular rhythm and intact distal pulses.  Frequent extrasystoles are present.   Occasionally becomes profoundly tachycardic  Pulmonary/Chest: Effort normal and breath sounds normal.  Abdominal: Soft. She exhibits no distension. There is no tenderness. There is no rebound.  Musculoskeletal: Normal range of motion.  Neurological: She is alert and oriented to person, place, and time. No cranial nerve deficit.  Skin: Skin is warm and  dry.  Psychiatric: She has a normal mood and affect.  Vitals reviewed.   ED Course  Procedures (including critical care time) Labs Review Labs Reviewed  BASIC METABOLIC PANEL - Abnormal; Notable for the following:    Potassium 3.4 (*)    Glucose, Bld 123 (*)    All other components within normal limits  CBC  URINALYSIS, ROUTINE W REFLEX MICROSCOPIC (NOT AT [facility])  MAGNESIUM  TSH  T4, FREE  I-STAT TROPOININ, ED     Imaging Review No results found. I have personally reviewed and evaluated these images and lab results as part of my medical decision-making.   EKG Interpretation   Date/Time:  Friday May 15 2015 14:46:55 EDT Ventricular Rate:  90 PR Interval:  158 QRS Duration: 74 QT Interval:  338 QTC Calculation: 413 R Axis:   76 Text Interpretation:  Sinus rhythm with Premature atrial complexes  Possible Left atrial enlargement Nonspecific ST abnormality Abnormal ECG  Confirmed by LIU MD, Annabelle HarmanANA ([id number]) on 05/15/2015 4:16:02 PM Also confirmed by  Verdie MosherLIU MD, DANA ([id number])  on 05/15/2015 4:21:37 PM      MDM   Final diagnoses:  None    Patient presents with acute onset of syncopal episodes with associated palpitations. Visual evaluation the patient is comfortable, alert and oriented sitting in bed in no distress. Interviewing the patient, she had several episodes of a narrow complex tachycardia, with rates to the 180s. She strips are reviewed, and the patient appears to be going in and out of a narrow complex, supraventricular tachycardia, what appears to be either a fast atrial flutter, or atrial fibrillation. Additional twelve-lead EKG was obtained, and does appear to be atrial fib, but the patient has spontaneously converted back to normal sinus rhythm. Labs were obtained, viewed and reviewed by me for his my medical decision-making. She has an undetectable troponin, has a mild hypokalemia, but do not believe this is etiology of her symptoms. She also has a normal mag. Will check thyroid studies. She was given IV metoprolol with improvement in the rate of her paroxysms. She has had no further episodes of syncope.  Will admit to hospitalist for observation. Believe she is stable for telemetry at this time.    Erskine Emeryhris Salaam Battershell, MD 05/16/15 0003  Lavera Guiseana Duo Liu, MD 05/16/15 [phone]

## 2015-05-15 NOTE — ED Provider Notes (Signed)
I saw and evaluated the patient, reviewed the resident's note and I agree with the findings and plan.   EKG Interpretation   Date/Time:  Friday May 15 2015 14:46:55 EDT Ventricular Rate:  90 PR Interval:  158 QRS Duration: 74 QT Interval:  338 QTC Calculation: 413 R Axis:   76 Text Interpretation:  Sinus rhythm with Premature atrial complexes  Possible Left atrial enlargement Nonspecific ST abnormality Abnormal ECG  Confirmed by Draven Natter MD, Annabelle HarmanANA ([id number]) on 05/15/2015 4:16:02 PM Also confirmed by  Verdie MosherLIU MD, Shanya Ferriss ([id number])  on 05/15/2015 4:21:6637 PM      55 year old female with history of COPD who presents with syncope. States feeling unwell today and had frequent episodes of lightheadedness, chest tightness and sob. In [employer] [place] and had syncope episode. No prior history of syncope or chest pain. No recent illnesses. Denies N/V/D. On arrival, with intermittent runs of tachycardia to 180s. Noted to be atrial flutter when caught on EKG. Cardiopulmonary exam otherwise unremarkable and normotensive. Neg trop. Will check thryoid and electrolytes. Admitted to general medicine for new onset atrial fibrillation with syncope.  Lavera Guiseana Duo Frederick Marro, MD 05/16/15 [phone]

## 2015-05-16 ENCOUNTER — Observation Stay (HOSPITAL_BASED_OUTPATIENT_CLINIC_OR_DEPARTMENT_OTHER): Payer: BLUE CROSS/BLUE SHIELD

## 2015-05-16 ENCOUNTER — Encounter (HOSPITAL_COMMUNITY): Payer: Self-pay | Admitting: Internal Medicine

## 2015-05-16 DIAGNOSIS — R55 Syncope and collapse: Principal | ICD-10-CM

## 2015-05-16 DIAGNOSIS — E876 Hypokalemia: Secondary | ICD-10-CM | POA: Diagnosis not present

## 2015-05-16 DIAGNOSIS — I471 Supraventricular tachycardia: Secondary | ICD-10-CM | POA: Diagnosis not present

## 2015-05-16 HISTORY — DX: Syncope and collapse: R55

## 2015-05-16 LAB — COMPREHENSIVE METABOLIC PANEL
ALBUMIN: 3.2 g/dL — AB (ref 3.5–5.0)
ALT: 13 U/L — ABNORMAL LOW (ref 14–54)
ANION GAP: 7 (ref 5–15)
AST: 14 U/L — ABNORMAL LOW (ref 15–41)
Alkaline Phosphatase: 48 U/L (ref 38–126)
BUN: 11 mg/dL (ref 6–20)
CHLORIDE: 113 mmol/L — AB (ref 101–111)
CO2: 24 mmol/L (ref 22–32)
Calcium: 8.5 mg/dL — ABNORMAL LOW (ref 8.9–10.3)
Creatinine, Ser: 0.7 mg/dL (ref 0.44–1.00)
GFR calc non Af Amer: >60 mL/min (ref >60)
GLUCOSE: 104 mg/dL — AB (ref 65–99)
POTASSIUM: 4.7 mmol/L (ref 3.5–5.1)
SODIUM: 144 mmol/L (ref 135–145)
Total Bilirubin: 0.7 mg/dL (ref 0.3–1.2)
Total Protein: 5 g/dL — ABNORMAL LOW (ref 6.5–8.1)

## 2015-05-16 LAB — ECHOCARDIOGRAM COMPLETE
HEIGHTINCHES: 65 in
Weight: 2878.4 oz

## 2015-05-16 LAB — D-DIMER, QUANTITATIVE: D-Dimer, Quant: <0.27 ug/mL-FEU (ref 0.00–0.50)

## 2015-05-16 LAB — TROPONIN I

## 2015-05-16 MED ORDER — METOPROLOL SUCCINATE ER 25 MG PO TB24: 25.0000 mg | ORAL_TABLET | Freq: Every day | ORAL | Status: DC

## 2015-05-16 MED ORDER — LEVALBUTEROL HCL 0.63 MG/3ML IN NEBU: 0.6300 mg | INHALATION_SOLUTION | Freq: Four times a day (QID) | RESPIRATORY_TRACT | Status: DC | PRN

## 2015-05-16 NOTE — Progress Notes (Signed)
  Echocardiogram 2D Echocardiogram has been performed.  Delcie RochENNINGTON, Huriel Matt 05/16/2015, 3:04 PM

## 2015-05-16 NOTE — Consult Note (Signed)
Admit date: 05/15/2015 Referring Physician  Dr. Jomarie Longs Primary Physician  None Primary Cardiologist  None Reason for Consultation  SVT and syncope  HPI: Grace Tucker is a 55 y.o. female with a past medical history of asthma who came to the emergency department after losing consciousness while shopping at [employer].  Per patient, she was she was tired this morning because she was only able to sleep one hour last night. She has been under a lot of stress recently because of numerous reasons, including closing on a house next week and she is also expecting the first visit of her grandson who is coming from [place] with the patient's son. She went shopping with her husband for a baby car seat. While checking one of the car seat products, she started feeling very weird with a funny sensation in her chest with lightheadedness, followed by weakness and then collapsed. Her husband states that she was probably passed out for a minute or so, but then she had 2 or 3 more similar similar episodes over the next 30 minutes. She denies chest pain, diaphoresis, dyspnea, edema of lower extremities, PND or orthopnea. She denies calf pain, alcohol, tobacco or recreational drug use.  In the ER, the patient had several runs of narrow complex tachycardia at 180s. This was treated successfully with 2 doses of metoprolol 5 mg IVP. She was admitted for syncope workup and electrolyte replacement.  Cardiology is now asked to consult.  She states that remotely she has had some brief palpitations in the past when using her albuterol inhaler but she has not used it in quite a while.  She denies any chest pain or SOB, DOE, LE edema, dizziness or prior syncope.     PMH:   Past Medical History  Diagnosis Date  . Asthma      PSH:   Past Surgical History  Procedure Laterality Date  . Appendectomy    . Cholecystectomy N/A 01/18/2013    Procedure: LAPAROSCOPIC CHOLECYSTECTOMY WITH INTRAOPERATIVE CHOLANGIOGRAM;   Surgeon: Velora Heckler, MD;  Location: [facility] OR;  Service: General;  Laterality: N/A;    Allergies:  Ceclor Prior to Admit Meds:   Prescriptions prior to admission  Medication Sig Dispense Refill Last Dose  . acetaminophen (TYLENOL) 500 MG tablet Take 1,000 mg by mouth every 6 (six) hours as needed for mild pain.   Past Week at Unknown time  . ibuprofen (ADVIL,MOTRIN) 200 MG tablet Take 200 mg by mouth every 6 (six) hours as needed for moderate pain.   05/15/2015 at Unknown time   Fam HX:    Family History  Problem Relation Age of Onset  . Cancer Mother     colon   Social HX:    Social History   Social History  . Marital Status: Married    Spouse Name: N/A  . Number of Children: N/A  . Years of Education: N/A   Occupational History  . Not on file.   Social History Main Topics  . Smoking status: Never Smoker   . Smokeless tobacco: Not on file  . Alcohol Use: No  . Drug Use: No  . Sexual Activity: Yes   Other Topics Concern  . Not on file   Social History Narrative     ROS:  All 11 ROS were addressed and are negative except what is stated in the HPI  Physical Exam: Blood pressure 106/72, pulse 60, temperature 97.8 F (36.6 C), temperature source Oral, resp. rate 14, height  5\' 5"  (1.651 m), weight 179 lb 14.4 oz (81.602 kg), SpO2 98 %.   General: Well developed, well nourished, in no acute distress Head: Eyes PERRLA, No xanthomas.   Normal cephalic and atramatic  Lungs:   Clear bilaterally to auscultation and percussion. Heart:   HRRR S1 S2 Pulses are 2+ & equal.            No carotid bruit. No JVD.  No abdominal bruits. No femoral bruits. Abdomen: Bowel sounds are positive, abdomen soft and non-tender without masses  Extremities:   No clubbing, cyanosis or edema.  DP +1 Neuro: Alert and oriented X 3. Psych:  Good affect, responds appropriately    Labs:   Lab Results  Component Value Date   WBC 6.7 05/15/2015   HGB 12.9 05/15/2015   HCT 38.4 05/15/2015   MCV  89.7 05/15/2015   PLT 294 05/15/2015    Recent Labs Lab 05/16/15 0243  NA 144  K 4.7  CL 113*  CO2 24  BUN 11  CREATININE 0.70  CALCIUM 8.5*  PROT 5.0*  BILITOT 0.7  ALKPHOS 48  ALT 13*  AST 14*  GLUCOSE 104*   No results found for: PTT No results found for: INR, PROTIME Lab Results  Component Value Date   TROPONINI <0.03 05/16/2015    No results found for: CHOL No results found for: HDL No results found for: LDLCALC No results found for: TRIG No results found for: CHOLHDL No results found for: LDLDIRECT    Radiology:  No results found.  EKG:  NSR with nonspecific ST abnormality  ASSESSMENT/PLAN:   1.  Palpitations with SVT noted on EKG.  She has never had any palpitations like this before but has had a few brief episodes when she has used Albuterol for asthma in the past.  She has not used her inhaler in some time.  2.  SVT resulting in syncope.  EKG in ER showed narrow complex tachycardia c/w SVT and spontaneously converted to NSR.  TSH normal.  Troponin neg x 3.  Potassium was low at the time at 3.4 but unlikely the etiology of arrhythmia. She is currently on Toprol for suppression.  OK to continue this unless she starts to have exacerbation of her asthma.  Will check 2D echo to assess LVF. 3.  Abnormal EKG with nonspecific ST abnormality on baseline EKG in NSR.  She has no real CRFs for CAD.  If echo shows normal LVF consider outpt ETT.   4.  Asthma - agree with changing albuterol to Xopenex.   5.  Syncope secondary to #2 with fast HR  If echo normal then ok to d/c home with outpt followup with Dr. Elberta Fortisamnitz in EP.  Quintella ReichertURNER,TRACI R, MD  05/16/2015  11:17 AM

## 2015-05-16 NOTE — Progress Notes (Signed)
Patient discharged home with husband. Medications were educated on and the patient stated she understood. IV was dc'd and was intact.

## 2015-05-16 NOTE — Progress Notes (Addendum)
T [facility] Progress Note                                            Patient Demographics:    Grace Tucker, is a 55 y.o. female, DOB - 03-08-1960, ZOX:[id number]  Admit date - 05/15/2015   Admitting Physician Bobette Moavid Manuel Ortiz, MD  Outpatient Primary MD for the patient is No primary care provider on file.  LOS - 1  Chief Complaint  Patient presents with  . Loss of Consciousness        Subjective:   Feels much better, no chest pain or dyspnea   Assessment  & Plan :     Syncope -2-3 episodes yesterday -SVT on monitor -started on Toprol -check ECHo, TSH/T4: WNL -also check D-dimer, [place]iology consulted, d/w Dr.Turner    Hypokalemia -corrected, likely lab error, went to 4.7 after only 20meq   Mild hyperglycemia -check Hbaic    Asthma -stable, nebs PRN  Code Status : Full Code  Family Communication  : spouse   Disposition Plan  : Home later today or in am Consults  : Cards  DVT Prophylaxis  :  Lovenox  Lab Results  Component Value Date   PLT 294 05/15/2015     Anti-infectives    None        Objective:   Filed Vitals:   05/15/15 1726 05/15/15 1745 05/15/15 1919 05/16/15 0437  BP:  119/95 123/90 102/63  Pulse:  64 67   Temp:   98 F (36.7 C) 97.8 F (36.6 C)  TempSrc:   Oral Oral  Resp:  14 14 14   Height: 5\' 5"  (1.651 m)  5\' 5"  (1.651 m)   Weight: 80.74 kg (178 lb)  81.602 kg (179 lb 14.4 oz)   SpO2:  100% 100% 98%    Wt Readings from Last 3 Encounters:  05/15/15 81.602 kg (179 lb 14.4 oz)  02/12/13 79.289 kg (174 lb 12.8 oz)  01/18/13 82.963 kg (182 lb 14.4 oz)     Intake/Output Summary (Last 24 hours) at 05/16/15 1001 Last data filed at 05/15/15 1753  Gross per 24 hour  Intake   1000 ml  Output      0 ml  Net   1000 ml     Physical Exam  Awake Alert, Oriented X 3, No new F.N deficits, Normal  affect HEENT: [place].AT,PERRAL, upple Neck,No JVD, No cervical lymphadenopathy appriciated.  Lungs:Symmetrical Chest wall movement, Good air movement bilaterally, CTAB CVS: RRR,No Gallops,Rubs or new Murmurs, No Parasternal Heave Abd: +ve B.Sounds, Abd Soft, No tenderness, No organomegaly appriciated, No rebound - guarding or rigidity. Ext:No Cyanosis, Clubbing or edema, No new Rash or bruise      Data Review:    CBC  Recent Labs Lab 05/15/15 1453  WBC 6.7  HGB 12.9  HCT 38.4  PLT 294  MCV 89.7  MCH 30.1  MCHC 33.6  RDW 12.3    Chemistries   Recent Labs Lab 05/15/15 1453 05/15/15 1741 05/16/15 0243  NA 143  --  144  K 3.4*  --  4.7  CL 109  --  113*  CO2 24  --  24  GLUCOSE 123*  --  104*  BUN 17  --  11  CREATININE 0.91  --  0.70  CALCIUM 9.5  --  8.5*  MG  --  2.1  --   AST  --   --  14*  ALT  --   --  13*  ALKPHOS  --   --  48  BILITOT  --   --  0.7   ------------------------------------------------------------------------------------------------------------------ No results for input(s): CHOL, HDL, LDLCALC, TRIG, CHOLHDL, LDLDIRECT in the last 72 hours.  No results found for: HGBA1C ------------------------------------------------------------------------------------------------------------------  Recent Labs  05/15/15 1741  TSH 3.595   ------------------------------------------------------------------------------------------------------------------ No results for input(s): VITAMINB12, FOLATE, FERRITIN, TIBC, IRON, RETICCTPCT in the last 72 hours.  Coagulation profile No results for input(s): INR, PROTIME in the last 168 hours.  No results for input(s): DDIMER in the last 72 hours.  Cardiac Enzymes  Recent Labs Lab 05/15/15 2123 05/16/15 0243  TROPONINI <0.03 <0.03   ------------------------------------------------------------------------------------------------------------------ No results found for: BNP  Inpatient Medications  Scheduled  Meds: . enoxaparin (LOVENOX) injection  40 mg Subcutaneous Q24H  . metoprolol succinate  25 mg Oral Daily  . phosphorus  500 mg Oral BID  . sodium chloride flush  3 mL Intravenous Q12H   Continuous Infusions:  PRN Meds:.acetaminophen, levalbuterol  Micro Results No results found for this or any previous visit (from the past 240 hour(s)).  Radiology Reports No results found.  Time Spent in minutes     Shasha Buchbinder M.D on 05/16/2015 at 10:01 AM  Between 7am to 7pm - Pager - [phone]  After 7pm go to [email or web address] - password [facility]  [facility] -  Office  [phone]

## 2015-05-16 NOTE — Progress Notes (Signed)
VASCULAR LAB PRELIMINARY  PRELIMINARY  PRELIMINARY  PRELIMINARY  Carotid duplex completed.    Preliminary report:  1-39% ICA plaquing.  Vertebral artery flow is antegrade.  Macy Lingenfelter, RVT 05/16/2015, 11:06 AM

## 2015-05-18 LAB — HEMOGLOBIN A1C
Hgb A1c MFr Bld: 5.3 % (ref 4.8–5.6)
MEAN PLASMA GLUCOSE: 105 mg/dL

## 2015-05-19 NOTE — Discharge Summary (Addendum)
Physician Discharge Summary  Grace Tucker ZOX:[id number] DOB: 14-Oct-1960 DOA: 05/15/2015  PCP: No primary care provider on file.  Admit date: 05/15/2015 Discharge date: 05/16/2015  Time spent:45 minutes  Recommendations for Outpatient Follow-up:  1. Cardiology Dr.Camnitz in 1 month 2. PCP in 1 week-please FU Hbaic  Discharge Diagnoses:  Principal Problem:   SVT (supraventricular tachycardia) (HCC) Active Problems:   Hypokalemia   Hypophosphatemia   Asthma   Syncope   Discharge Condition: stable  Diet recommendation: regular  Filed Weights   05/15/15 1726 05/15/15 1919  Weight: 80.74 kg (178 lb) 81.602 kg (179 lb 14.4 oz)    History of present illness:  HPI: Grace Tucker is a 55 y.o. female with a past medical history of asthma who came to the emergency department after losing consciousness while shopping at [employer]. Per patient, she was she was tired 4/7 morning because she was only able to sleep one hour last night. She reported being under a lot of stress recently because of numerous reasons, including closing on a house next week and she was also expecting the first visit of her grandson [place] with the patient's son. She went shopping with her husband for a baby car seat. While checking one of the offer car seat products, she started having palpitations, lightheadedness, followed by weakness and then collapsed. Her husband states that she was probably passed out for a minute or so  Hospital Course:  Syncope -2 episodes yesterday, noted to have SVT on monitor -started on Toprol -2D ECHO with normal EF and wall motion, TSH/T4: WNL -also checked dimer, [facility] -Cardiology consulted, d/w Dr.Turner, recommended continue Toprol and FU with Dr.Camnitz   Hypokalemia -corrected, likely lab error, went to 4.7 after only 20meq  Mild hyperglycemia -check Hbaic, pending at  discharge  Procedures:  ECHO  Consultations:  Cardiology Dr.Turner  Discharge Exam: Filed Vitals:   05/16/15 1035 05/16/15 1438  BP: 106/72 109/66  Pulse: 60 59  Temp:  97.8 F (36.6 C)  Resp:      General: AAOx3 Cardiovascular: S1S2/RRR Respiratory: CTAB  Discharge Instructions   Discharge Instructions    Diet - low sodium heart healthy    Complete by:  As directed      Increase activity slowly    Complete by:  As directed           Discharge Medication List as of 05/16/2015  5:16 PM    START taking these medications   Details  metoprolol succinate (TOPROL-XL) 25 MG 24 hr tablet Take 1 tablet (25 mg total) by mouth daily., Starting 05/16/2015, Until Discontinued, Print      CONTINUE these medications which have NOT CHANGED   Details  acetaminophen (TYLENOL) 500 MG tablet Take 1,000 mg by mouth every 6 (six) hours as needed for mild pain., Until Discontinued, Historical Med    ibuprofen (ADVIL,MOTRIN) 200 MG tablet Take 200 mg by mouth every 6 (six) hours as needed for moderate pain., Until Discontinued, Historical Med       Allergies  Allergen Reactions  . Ceclor [Cefaclor] Nausea And Vomiting   Follow-up Information    Follow up with Will Jorja LoaMartin Camnitz, MD. Schedule an appointment as soon as possible for a visit in 3 weeks.   Specialty:  Cardiology   Why:  his office should call you Monday for an appt.    Contact information:   [address]  [place] [address] [phone]        The results of significant diagnostics from this hospitalization (including imaging, microbiology, ancillary and laboratory) are listed below for reference.    Significant Diagnostic Studies: No results found.  Microbiology: No results found for this or any previous visit (from the past 240 hour(s)).   Labs: Basic Metabolic Panel:  Recent Labs Lab 05/15/15 1453 05/15/15 1741 05/15/15 1909 05/16/15 0243  NA 143  --   --  144  K 3.4*  --   --  4.7   CL 109  --   --  113*  CO2 24  --   --  24  GLUCOSE 123*  --   --  104*  BUN 17  --   --  11  CREATININE 0.91  --   --  0.70  CALCIUM 9.5  --   --  8.5*  MG  --  2.1  --   --   PHOS  --   --  2.3*  --    Liver Function Tests:  Recent Labs Lab 05/16/15 0243  AST 14*  ALT 13*  ALKPHOS 48  BILITOT 0.7  PROT 5.0*  ALBUMIN 3.2*   No results for input(s): LIPASE, AMYLASE in the last 168 hours. No results for input(s): AMMONIA in the last 168 hours. CBC:  Recent Labs Lab 05/15/15 1453  WBC 6.7  HGB 12.9  HCT 38.4  MCV 89.7  PLT 294   Cardiac Enzymes:  Recent Labs Lab 05/15/15 2123 05/16/15 0243  TROPONINI <0.03 <0.03   BNP: BNP (last 3 results) No results for input(s): BNP in the last 8760 hours.  ProBNP (last 3 results) No results for input(s): PROBNP in the last 8760 hours.  CBG: No results for input(s): GLUCAP in the last 168 hours.     SignedZannie Cove MD.  [facility] 05/19/2015, 3:39 PM

## 2015-05-27 NOTE — Progress Notes (Signed)
Electrophysiology Office Note   Date:  05/28/2015   ID:  Yeilin Zweber Tucker, DOB August 06, 1960, MRN [id number]  PCP:  No PCP Per Patient  Cardiologist:  Mayford Knife Primary Electrophysiologist:  Will Jorja Loa, MD    Chief Complaint  Patient presents with  . New Patient (Initial Visit)  . SVT     History of Present Illness: Grace Tucker is a 55 y.o. female who presents today for electrophysiology evaluation.   She presented to the hospital on 05/15/15 after an episode of syncope.  She was under quite a bit of stress around the time of her syncope.  Prior to the syncope, she began feeling a funny feeling in her chest with lightheadedness and weakness, then collapsed.  She had 2-3 similar episodes over the next 30 minutes.  In the ER, she had several runs of SVT in the 180s.  She was treated with metoprolol 5 mg x2.  Since being discharged from the hospital, she has had continued palpitations, but they have been much less severe.She has not had any syncope or near syncope and her episodes have been short lived. They're not associated with shortness of breath or chest pain.   While she was in the emergency room, she continued to have episodes of SVT with symptoms that were similar to [place] [employer].   Today, she denies symptoms of palpitations, chest pain, shortness of breath, orthopnea, PND, lower extremity edema, claudication, dizziness, presyncope, syncope, bleeding, or neurologic sequela. The patient is tolerating medications without difficulties and is otherwise without complaint today.    Past Medical History  Diagnosis Date  . Asthma   . Syncope 05/16/2015    In setting of SVT   Past Surgical History  Procedure Laterality Date  . Appendectomy    . Cholecystectomy N/A 01/18/2013    Procedure: LAPAROSCOPIC CHOLECYSTECTOMY WITH INTRAOPERATIVE CHOLANGIOGRAM;  Surgeon: Velora Heckler, MD;  Location: [facility] OR;  Service: General;  Laterality: N/A;     Current  Outpatient Prescriptions  Medication Sig Dispense Refill  . acetaminophen (TYLENOL) 500 MG tablet Take 1,000 mg by mouth every 6 (six) hours as needed for mild pain.    Marland Kitchen ibuprofen (ADVIL,MOTRIN) 200 MG tablet Take 200 mg by mouth every 6 (six) hours as needed for moderate pain.    . metoprolol succinate (TOPROL-XL) 25 MG 24 hr tablet Take 1 tablet (25 mg total) by mouth daily. 30 tablet 0  . ranitidine (ZANTAC) 150 MG capsule Take 150 mg by mouth daily as needed for heartburn.     No current facility-administered medications for this visit.    Allergies:   Ceclor   Social History:  The patient  reports that she has never smoked. She does not have any smokeless tobacco history on file. She reports that she does not drink alcohol or use illicit drugs.   Family History:  The patient's family history includes Cancer in her mother.    ROS:  Please see the history of present illness.   Otherwise, review of systems is positive for fatigue, palpitations, cough, DOE, constipation, dizziness.   All other systems are reviewed and negative.    PHYSICAL EXAM: VS:  BP 110/72 mmHg  Pulse 62  Ht  (1.651 m)  Wt 180 lb 6.4 oz (81.829 kg)  BMI 30.02 kg/m2 , BMI Body mass index is 30.02 kg/(m^2). GEN: Well nourished, well developed, in no acute distress HEENT: normal Neck: no JVD, carotid bruits, or masses Cardiac: RRR; no murmurs,  rubs, or gallops,no edema  Respiratory:  clear to auscultation bilaterally, normal work of breathing GI: soft, nontender, nondistended, + BS MS: no deformity or atrophy Skin: warm and dry Neuro:  Strength and sensation are intact Psych: euthymic mood, full affect  EKG:  EKG is ordered today. The ekg ordered today shows sinus rhyth is a little bit more information is m rate 62  Recent Labs: 05/15/2015: Hemoglobin 12.9; Magnesium 2.1; Platelets 294; TSH 3.595 05/16/2015: ALT 13*; BUN 11; Creatinine, Ser 0.70; Potassium 4.7; Sodium 144    Lipid Panel  No results  found for: CHOL, TRIG, HDL, CHOLHDL, VLDL, LDLCALC, LDLDIRECT   Wt Readings from Last 3 Encounters:  05/28/15 180 lb 6.4 oz (81.829 kg)  05/15/15 179 lb 14.4 oz (81.602 kg)  02/12/13 174 lb 12.8 oz (79.289 kg)      Other studies Reviewed: Additional studies/ records that were reviewed today include: TTE 05/16/15  Review of the above records today demonstrates:  - Left ventricle: The cavity size was normal. Systolic function was  normal. The estimated ejection fraction was in the range of 50%  to 55%. Wall motion was normal; there were no regional wall  motion abnormalities. - Aortic valve: Trileaflet; normal thickness, mildly calcified  leaflets.   ASSESSMENT AND PLAN:  1.  SVT: Likely resulted in her syncope. She is currently on metoprolol for suppression of her arrhythmia.  ECG in the ER showed narrow complex tachycardia, which appears to be a short RP tachycardia.  Discussed options of ablation vs medical management.  Discussed risks and benefits of the procedure.  Risks include but are not limited to bleeding, tamponade, heart block, stroke.She says that she would like to think about whether or not she wants ablation. I have given her a prescription for flecainide in the interim to see if this will help with her palpitations. I told her that she will need to continue taking the Toprol-XL along with the flecainide. She says that she will call back to discuss possibility of ablation. Because she didn't pass out, I have told her that she is not to drive for 6 months since her most recent [place] [facility] [facility].  Current medicines are reviewed at length with the patient today.   The patient does not have concerns regarding her medicines.  The following changes were made today:  Flecainide 50 mg BID  Labs/ tests ordered today include:  No orders of the defined types were placed in this encounter.     Disposition:   FU with Will Camnitz 3 months  Signed, Will Jorja LoaMartin  Camnitz, MD  05/28/2015 10:41 AM     [facility] [address] [place] [address] [phone] (office) [phone] (fax)

## 2015-05-28 ENCOUNTER — Encounter: Payer: Self-pay | Admitting: Cardiology

## 2015-05-28 ENCOUNTER — Ambulatory Visit (INDEPENDENT_AMBULATORY_CARE_PROVIDER_SITE_OTHER): Payer: BLUE CROSS/BLUE SHIELD | Admitting: Cardiology

## 2015-05-28 VITALS — BP 110/72 | HR 62 | Ht 65.0 in | Wt 180.4 lb

## 2015-05-28 DIAGNOSIS — Z79899 Other long term (current) drug therapy: Secondary | ICD-10-CM

## 2015-05-28 DIAGNOSIS — I471 Supraventricular tachycardia: Secondary | ICD-10-CM

## 2015-05-28 MED ORDER — FLECAINIDE ACETATE 50 MG PO TABS: 50.0000 mg | ORAL_TABLET | Freq: Two times a day (BID) | ORAL | Status: DC

## 2015-05-28 NOTE — Patient Instructions (Addendum)
Medication Instructions:  Your physician has recommended you make the following change in your medication:  1) START Flecainide 50 mg twice a day  -- (start this medication within 7-10 days prior to the stress test) --  Labwork: None ordered  Testing/Procedures: Your physician has requested that you have an exercise tolerance test. For further information please visit [email or web address] Please also follow instruction sheet, as given.  Follow-Up: Your physician recommends that you schedule a follow-up appointment in: 3 months with Dr. Elberta Fortis.  Any Other Special Instructions Will Be Listed Below (If Applicable). - Please establish with a primary doctor.   You may call [phone] - they may be able to assist you in finding a physician.  - No driving for 6 months from the date of when you passed out.  (This is DMV regulations)   If you need a refill on your cardiac medications before your next appointment, please call your pharmacy.  Thank you for choosing [facility]!!   Dory Horn, RN [phone]  Flecainide tablets What is this medicine? FLECAINIDE (FLEK a nide) is an antiarrhythmic drug. This medicine is used to prevent irregular heart rhythm. It can also slow down fast heartbeats called tachycardia. This medicine may be used for other purposes; ask your health care provider or pharmacist if you have questions. What should I tell my health care provider before I take this medicine? They need to know if you have any of these conditions: -abnormal levels of potassium in the blood -heart disease including heart rhythm and heart rate problems -kidney or liver disease -recent heart attack -an unusual or allergic reaction to flecainide, local anesthetics, other medicines, foods, dyes, or preservatives -pregnant or trying to get pregnant -breast-feeding How should I use this medicine? Take this medicine by mouth with a glass of water. Follow the directions on the  prescription label. You can take this medicine with or without food. Take your doses at regular intervals. Do not take your medicine more often than directed. Do not stop taking this medicine suddenly. This may cause serious, heart-related side effects. If your doctor wants you to stop the medicine, the dose may be slowly lowered over time to avoid any side effects. Talk to your pediatrician regarding the use of this medicine in children. While this drug may be prescribed for children as young as 1 year of age for selected conditions, precautions do apply. Overdosage: If you think you have taken too much of this medicine contact a poison control center or emergency room at once. NOTE: This medicine is only for you. Do not share this medicine with others. What if I miss a dose? If you miss a dose, take it as soon as you can. If it is almost time for your next dose, take only that dose. Do not take double or extra doses. What may interact with this medicine? Do not take this medicine with any of the following medications: -amoxapine -arsenic trioxide -certain antibiotics like clarithromycin, erythromycin, gatifloxacin, gemifloxacin, levofloxacin, moxifloxacin, sparfloxacin, or troleandomycin -certain antidepressants called tricyclic antidepressants like amitriptyline, imipramine, or nortriptyline -certain medicines to control heart rhythm like disopyramide, dofetilide, encainide, moricizine, procainamide, propafenone, and quinidine -cisapride -cyclobenzaprine -delavirdine -droperidol -haloperidol -hawthorn -imatinib -levomethadyl -maprotiline -medicines for malaria like chloroquine and halofantrine -pentamidine -phenothiazines like chlorpromazine, mesoridazine, prochlorperazine, thioridazine -pimozide -quinine -ranolazine -ritonavir -sertindole -ziprasidone This medicine may also interact with the following medications: -cimetidine -medicines for angina or high blood  pressure -medicines to control heart rhythm like amiodarone and digoxin This  list may not describe all possible interactions. Give your health care provider a list of all the medicines, herbs, non-prescription drugs, or dietary supplements you use. Also tell them if you smoke, drink alcohol, or use illegal drugs. Some items may interact with your medicine. What should I watch for while using this medicine? Visit your doctor or health care professional for regular checks on your progress. Because your condition and the use of this medicine carries some risk, it is a good idea to carry an identification card, necklace or bracelet with details of your condition, medications and doctor or health care professional. Check your blood pressure and pulse rate regularly. Ask your health care professional what your blood pressure and pulse rate should be, and when you should contact him or her. Your doctor or health care professional also may schedule regular blood tests and electrocardiograms to check your progress. You may get drowsy or dizzy. Do not drive, use machinery, or do anything that needs mental alertness until you know how this medicine affects you. Do not stand or sit up quickly, especially if you are an older patient. This reduces the risk of dizzy or fainting spells. Alcohol can make you more dizzy, increase flushing and rapid heartbeats. Avoid alcoholic drinks. What side effects may I notice from receiving this medicine? Side effects that you should report to your doctor or health care professional as soon as possible: -chest pain, continued irregular heartbeats -difficulty breathing -swelling of the legs or feet -trembling, shaking -unusually weak or tired Side effects that usually do not require medical attention (report to your doctor or health care professional if they continue or are bothersome): -blurred vision -constipation -headache -nausea, vomiting -stomach pain This list may not  describe all possible side effects. Call your doctor for medical advice about side effects. You may report side effects to FDA at 1-800-FDA-1088. Where should I keep my medicine? Keep out of the reach of children. Store at room temperature between 15 and 30 degrees C (59 and 86 degrees F). Protect from light. Keep container tightly closed. Throw away any unused medicine after the expiration date. NOTE: This sheet is a summary. It may not cover all possible information. If you have questions about this medicine, talk to your doctor, pharmacist, or health care provider.    2016, Elsevier/Gold Standard. (2007-05-30 16:46:09)     Cardiac Ablation Cardiac ablation is a procedure to disable a small amount of heart tissue in very specific places. The heart has many electrical connections. Sometimes these connections are abnormal and can cause the heart to beat very fast or irregularly. By disabling some of the problem areas, heart rhythm can be improved or made normal. Ablation is done for people who:   Have Wolff-Parkinson-White syndrome.   Have other fast heart rhythms (tachycardia).   Have taken medicines for an abnormal heart rhythm (arrhythmia) that resulted in:   No success.   Side effects.   May have a high-risk heartbeat that could result in death.  LET [facility]OUR HEALTH CARE PROVIDER KNOW ABOUT:   Any allergies you have or any previous reactions you have had to X-ray dye, food (such as seafood), medicine, or tape.   All medicines you are taking, including vitamins, herbs, eye drops, creams, and over-the-counter medicines.   Previous problems you or members of your family have had with the use of anesthetics.   Any blood disorders you have.   Previous surgeries or procedures (such as a kidney transplant) you have had.  Medical conditions you have (such as kidney failure).  RISKS AND COMPLICATIONS Generally, cardiac ablation is a safe procedure. However, problems can  occur and include:   Increased risk of cancer. Depending on how long it takes to do the ablation, the dose of radiation can be high.  Bruising and bleeding where a thin, flexible tube (catheter) was inserted during the procedure.   Bleeding into the chest, especially into the sac that surrounds the heart (serious).  Need for a permanent pacemaker if the normal electrical system is damaged.   The procedure may not be fully effective, and this may not be recognized for months. Repeat ablation procedures are sometimes required. BEFORE THE PROCEDURE   Follow any instructions from your health care provider regarding eating and drinking before the procedure.   Take your medicines as directed at regular times with water, unless instructed otherwise by your health care provider. If you are taking diabetes medicine, including insulin, ask how you are to take it and if there are any special instructions you should follow. It is common to adjust insulin dosing the day of the ablation.  PROCEDURE  An ablation is usually performed in a catheterization laboratory with the guidance of fluoroscopy. Fluoroscopy is a type of X-ray that helps your health care provider see images of your heart during the procedure.   An ablation is a minimally invasive procedure. This means a small cut (incision) is made in either your neck or groin. Your health care provider will decide where to make the incision based on your medical history and physical exam.  An IV tube will be started before the procedure begins. You will be given an anesthetic or medicine to help you relax (sedative).  The skin on your neck or groin will be numbed. A needle will be inserted into a large vein in your neck or groin and catheters will be threaded to your heart.  A special dye that shows up on fluoroscopy pictures may be injected through the catheter. The dye helps your health care provider see the area of the heart that needs  treatment.  The catheter has electrodes on the tip. When the area of heart tissue that is causing the arrhythmia is found, the catheter tip will send an electrical current to the area and "scar" the tissue. Three types of energy can be used to ablate the heart tissue:   Heat (radiofrequency energy).   Laser energy.   Extreme cold (cryoablation).   When the area of the heart has been ablated, the catheter will be taken out. Pressure will be held on the insertion site. This will help the insertion site clot and keep it from bleeding. A bandage will be placed on the insertion site.  AFTER THE PROCEDURE   After the procedure, you will be taken to a recovery area where your vital signs (blood pressure, heart rate, and breathing) will be monitored. The insertion site will also be monitored for bleeding.   You will need to lie still for 4-6 hours. This is to ensure you do not bleed from the catheter insertion site.    This information is not intended to replace advice given to you by your health care provider. Make sure you discuss any questions you have with your health care provider.   Document Released: 06/12/2008 Document Revised: 02/14/2014 Document Reviewed: 06/18/2012 Elsevier Interactive Patient Education [employer]

## 2015-06-05 ENCOUNTER — Telehealth: Payer: Self-pay | Admitting: Cardiology

## 2015-06-05 ENCOUNTER — Ambulatory Visit (INDEPENDENT_AMBULATORY_CARE_PROVIDER_SITE_OTHER): Payer: BLUE CROSS/BLUE SHIELD

## 2015-06-05 DIAGNOSIS — Z79899 Other long term (current) drug therapy: Secondary | ICD-10-CM

## 2015-06-05 DIAGNOSIS — I471 Supraventricular tachycardia: Secondary | ICD-10-CM

## 2015-06-05 NOTE — Telephone Encounter (Signed)
New Message  Pt request a call back to discuss ablation. Please call

## 2015-06-05 NOTE — Telephone Encounter (Signed)
She calls in explaining that she and her husband have been discussing options. She would like to move forward with ablation. She is aware I will review with Dr. Elberta Fortisamnitz on Monday and call her back.

## 2015-06-08 LAB — EXERCISE TOLERANCE TEST
CHL CUP STRESS STAGE 1 DBP: 75 mmHg
CHL CUP STRESS STAGE 1 SBP: 103 mmHg
CHL CUP STRESS STAGE 1 SPEED: 0 mph
CHL CUP STRESS STAGE 2 GRADE: 0 %
CHL CUP STRESS STAGE 2 HR: 60 {beats}/min
CHL CUP STRESS STAGE 3 HR: 60 {beats}/min
CHL CUP STRESS STAGE 4 HR: 92 {beats}/min
CHL CUP STRESS STAGE 4 SBP: 138 mmHg
CHL CUP STRESS STAGE 4 SPEED: 1.7 mph
CHL CUP STRESS STAGE 5 GRADE: 12 %
CHL CUP STRESS STAGE 5 HR: 102 {beats}/min
CHL CUP STRESS STAGE 5 SPEED: 2.5 mph
CHL CUP STRESS STAGE 6 DBP: 73 mmHg
CHL CUP STRESS STAGE 6 HR: 113 {beats}/min
CHL CUP STRESS STAGE 6 SBP: 155 mmHg
CHL CUP STRESS STAGE 6 SPEED: 3.4 mph
CHL CUP STRESS STAGE 7 GRADE: 16 %
CHL CUP STRESS STAGE 7 HR: 126 {beats}/min
CHL CUP STRESS STAGE 7 SPEED: 4.2 mph
CHL CUP STRESS STAGE 8 HR: 97 {beats}/min
CHL CUP STRESS STAGE 9 DBP: 75 mmHg
CHL CUP STRESS STAGE 9 GRADE: 0 %
CHL CUP STRESS STAGE 9 HR: 64 {beats}/min
CSEPPMHR: 75 %
Estimated workload: 11.4 METS
Exercise duration (min): 9 min
Exercise duration (sec): 51 s
MPHR: 166 {beats}/min
Peak HR: 126 {beats}/min
Percent HR: 78 %
RPE: 16
Rest HR: 51 {beats}/min
Stage 1 Grade: 0 %
Stage 1 HR: 59 {beats}/min
Stage 2 Speed: 1 mph
Stage 3 Grade: 0 %
Stage 3 Speed: 1 mph
Stage 4 DBP: 79 mmHg
Stage 4 Grade: 10 %
Stage 5 DBP: 77 mmHg
Stage 5 SBP: 136 mmHg
Stage 6 Grade: 14 %
Stage 8 DBP: 75 mmHg
Stage 8 Grade: 0 %
Stage 8 SBP: 159 mmHg
Stage 8 Speed: 1.5 mph
Stage 9 SBP: 144 mmHg
Stage 9 Speed: 0 mph

## 2015-06-10 ENCOUNTER — Encounter: Payer: Self-pay | Admitting: Cardiology

## 2015-06-10 NOTE — Telephone Encounter (Signed)
Spoke with patient earlier today. She would like to have ablation on 6/14. Patient aware I will call her Friday to review instructions. She is agreeable to plan.

## 2015-06-10 NOTE — Telephone Encounter (Signed)
This encounter was created in error - please disregard.

## 2015-06-10 NOTE — Telephone Encounter (Signed)
error 

## 2015-06-10 NOTE — Telephone Encounter (Signed)
F/u   Pt returning RN phone call- test results. Please call back and discuss.   

## 2015-06-15 ENCOUNTER — Other Ambulatory Visit: Payer: Self-pay | Admitting: *Deleted

## 2015-06-15 MED ORDER — METOPROLOL SUCCINATE ER 25 MG PO TB24: 25.0000 mg | ORAL_TABLET | Freq: Every day | ORAL | Status: DC

## 2015-06-15 NOTE — Telephone Encounter (Signed)
Since Dr Elberta Fortisamnitz has never filled this for the patient, just wanted to be sure ok to refill for her. Please advise. Thanks, MI

## 2015-06-15 NOTE — Telephone Encounter (Signed)
Ok to refill 

## 2015-06-15 NOTE — Telephone Encounter (Signed)
Pt is calling you back in regard to results from Stress test and to schedule the ablastion. Please f/u with her.

## 2015-06-15 NOTE — Telephone Encounter (Signed)
Patient and I agreed to speak on Wednesday on procedure details.

## 2015-06-17 ENCOUNTER — Encounter: Payer: Self-pay | Admitting: *Deleted

## 2015-06-17 NOTE — Telephone Encounter (Signed)
Reviewed procedure instructions.  Letter mailed to home address. H&P/pre labs scheduled for 6/7. Post ablation f/u 7/18. Patient verbalized understanding and agreeable to plan.

## 2015-07-09 ENCOUNTER — Encounter: Payer: Self-pay | Admitting: *Deleted

## 2015-07-13 NOTE — Progress Notes (Signed)
Electrophysiology Office Note   Date:  07/15/2015   ID:  [name], DOB 06/19/1960, MRN [id number]  PCP:  No PCP Per Patient  Cardiologist:  Mayford Knife Primary Electrophysiologist:  Hadiyah Maricle Jorja Loa, MD    Chief Complaint  Patient presents with  . Follow-up    H & P SVT ablation     History of Present Illness: Grace Tucker is a 55 y.o. female who presents today for electrophysiology evaluation.   She presented to the hospital on 05/15/15 after an episode of syncope.  She was under quite a bit of stress around the time of her syncope.  Prior to the syncope, she began feeling a funny feeling in her chest with lightheadedness and weakness, then collapsed.  She had 2-3 similar episodes over the next 30 minutes.  In the ER, she had several runs of SVT in the 180s.  She was treated with metoprolol 5 mg x2.  Since being discharged from the hospital, she has had continued palpitations, but they have been much less severe.  Since I last saw her, she has felt well. She is continuing to not drive. She does say that she is continuing to have short bursts of palpitations. She feels like they are in some ways related to being too hot and she goes to a cool room when they occur.  Today, she denies symptoms of palpitations, chest pain, shortness of breath, orthopnea, PND, lower extremity edema, claudication, dizziness, presyncope, syncope, bleeding, or neurologic sequela. The patient is tolerating medications without difficulties and is otherwise without complaint today.    Past Medical History  Diagnosis Date  . Asthma   . Syncope 05/16/2015    In setting of SVT   Past Surgical History  Procedure Laterality Date  . Appendectomy    . Cholecystectomy N/A 01/18/2013    Procedure: LAPAROSCOPIC CHOLECYSTECTOMY WITH INTRAOPERATIVE CHOLANGIOGRAM;  Surgeon: Velora Heckler, MD;  Location: [facility] OR;  Service: General;  Laterality: N/A;     Current Outpatient Prescriptions    Medication Sig Dispense Refill  . acetaminophen (TYLENOL) 500 MG tablet Take 1,000 mg by mouth every 6 (six) hours as needed for mild pain.    . flecainide (TAMBOCOR) 50 MG tablet Take 1 tablet (50 mg total) by mouth 2 (two) times daily. 60 tablet 3  . ibuprofen (ADVIL,MOTRIN) 200 MG tablet Take 200 mg by mouth every 6 (six) hours as needed for moderate pain.    . metoprolol succinate (TOPROL-XL) 25 MG 24 hr tablet Take 1 tablet (25 mg total) by mouth daily. 30 tablet 11  . ranitidine (ZANTAC) 150 MG capsule Take 150 mg by mouth daily as needed for heartburn.     No current facility-administered medications for this visit.    Allergies:   Ceclor   Social History:  The patient  reports that she has never smoked. She does not have any smokeless tobacco history on file. She reports that she does not drink alcohol or use illicit drugs.   Family History:  The patient's family history includes Colon cancer in her mother.    ROS:  Please see the history of present illness.   Otherwise, review of systems is positive for palpitations, sweats.   All other systems are reviewed and negative.    PHYSICAL EXAM: VS:  BP 108/80 mmHg  Pulse 64  Ht  (1.651 m)  Wt 182 lb 12.8 oz (82.918 kg)  BMI 30.42 kg/m2 , BMI Body mass index is 30.42 kg/(m^2).  GEN: Well nourished, well developed, in no acute distress HEENT: normal Neck: no JVD, carotid bruits, or masses Cardiac: RRR; no murmurs, rubs, or gallops,no edema  Respiratory:  clear to auscultation bilaterally, normal work of breathing GI: soft, nontender, nondistended, + BS MS: no deformity or atrophy Skin: warm and dry Neuro:  Strength and sensation are intact Psych: euthymic mood, full affect  EKG:  EKG is ordered today. The ekg ordered today shows sinus rhyth is a little bit more information is m rate 62  Recent Labs: 05/15/2015: Hemoglobin 12.9; Magnesium 2.1; Platelets 294; TSH 3.595 05/16/2015: ALT 13*; BUN 11; Creatinine, Ser 0.70;  Potassium 4.7; Sodium 144    Lipid Panel  No results found for: CHOL, TRIG, HDL, CHOLHDL, VLDL, LDLCALC, LDLDIRECT   Wt Readings from Last 3 Encounters:  07/15/15 182 lb 12.8 oz (82.918 kg)  05/28/15 180 lb 6.4 oz (81.829 kg)  05/15/15 179 lb 14.4 oz (81.602 kg)      Other studies Reviewed: Additional studies/ records that were reviewed today include: TTE 05/16/15  Review of the above records today demonstrates:  - Left ventricle: The cavity size was normal. Systolic function was  normal. The estimated ejection fraction was in the range of 50%  to 55%. Wall motion was normal; there were no regional wall  motion abnormalities. - Aortic valve: Trileaflet; normal thickness, mildly calcified  leaflets.   ASSESSMENT AND PLAN:  1.  SVT: She is currently on metoprolol for suppression of her arrhythmia.  ECG in the ER showed narrow complex tachycardia, which appears to be a short RP tachycardia.  Plan for ablation in 1 week. Risks and benefits of the procedure were explained. Risks include bleeding, tamponade, heart block, and stroke. She understands these risks and has agreed to ablation.   Current medicines are reviewed at length with the patient today.   The patient does not have concerns regarding her medicines.  The following changes were made today: none  Proceduredered today include:  Orders Placed This Encounter  Procedures  . Basic metabolic panel  . CBC w/Diff     Disposition:   FU with Anjelo Pullman 3 months  Signed, Kristena Wilhelmi Jorja LoaMartin Alanda Colton, MD  07/15/2015 9:46 AM     [facility] [address] [place] [address] [phone] (office) [phone] (fax)

## 2015-07-15 ENCOUNTER — Ambulatory Visit (INDEPENDENT_AMBULATORY_CARE_PROVIDER_SITE_OTHER): Payer: BLUE CROSS/BLUE SHIELD | Admitting: Cardiology

## 2015-07-15 ENCOUNTER — Encounter: Payer: Self-pay | Admitting: Cardiology

## 2015-07-15 VITALS — BP 108/80 | HR 64 | Ht 65.0 in | Wt 182.8 lb

## 2015-07-15 DIAGNOSIS — Z01812 Encounter for preprocedural laboratory examination: Secondary | ICD-10-CM

## 2015-07-15 DIAGNOSIS — I471 Supraventricular tachycardia: Secondary | ICD-10-CM

## 2015-07-15 LAB — CBC WITH DIFFERENTIAL/PLATELET
BASOS ABS: 0 {cells}/uL (ref 0–200)
Basophils Relative: 0 %
Eosinophils Absolute: 236 cells/uL (ref 15–500)
Eosinophils Relative: 4 %
HEMATOCRIT: 41.4 % (ref 35.0–45.0)
Hemoglobin: 14.2 g/dL (ref 11.7–15.5)
LYMPHS PCT: 25 %
Lymphs Abs: 1475 cells/uL (ref 850–3900)
MCH: 31.1 pg (ref 27.0–33.0)
MCHC: 34.3 g/dL (ref 32.0–36.0)
MCV: 90.6 fL (ref 80.0–100.0)
MONO ABS: 354 {cells}/uL (ref 200–950)
MPV: 9.5 fL (ref 7.5–12.5)
Monocytes Relative: 6 %
NEUTROS PCT: 65 %
Neutro Abs: 3835 cells/uL (ref 1500–7800)
Platelets: 297 10*3/uL (ref 140–400)
RBC: 4.57 MIL/uL (ref 3.80–5.10)
RDW: 13 % (ref 11.0–15.0)
WBC: 5.9 10*3/uL (ref 3.8–10.8)

## 2015-07-15 LAB — BASIC METABOLIC PANEL
BUN: 12 mg/dL (ref 7–25)
CO2: 26 mmol/L (ref 20–31)
Calcium: 9.3 mg/dL (ref 8.6–10.4)
Chloride: 105 mmol/L (ref 98–110)
Creat: 0.78 mg/dL (ref 0.50–1.05)
GLUCOSE: 88 mg/dL (ref 65–99)
POTASSIUM: 4.3 mmol/L (ref 3.5–5.3)
Sodium: 142 mmol/L (ref 135–146)

## 2015-07-15 NOTE — Patient Instructions (Addendum)
Medication Instructions:  Your physician recommends that you continue on your current medications as directed. Please refer to the Current Medication list given to you today.  Labwork: Pre procedure labs today: BMP & CBC  Testing/Procedures: You are scheduled for SVT Ablation on 07/22/15. Please review procedure instruction sheet given to you.  Follow-Up: Keep your post ablation follow up with Dr. Elberta Fortisamnitz on 08/25/15 at 11:00 a.m.  If you need a refill on your cardiac medications before your next appointment, please call your pharmacy.  Thank you for choosing [facility]!!   Dory HornSherri Price, RN [phone]

## 2015-07-22 ENCOUNTER — Ambulatory Visit (HOSPITAL_COMMUNITY)
Admission: RE | Admit: 2015-07-22 | Discharge: 2015-07-22 | Disposition: A | Discharge status: Home / Self Care | Payer: BLUE CROSS/BLUE SHIELD | Source: Ambulatory Visit | Attending: Cardiology | Admitting: Cardiology

## 2015-07-22 ENCOUNTER — Encounter (HOSPITAL_COMMUNITY)
Admission: RE | Disposition: A | Discharge status: Home / Self Care | Payer: Self-pay | Source: Ambulatory Visit | Attending: Cardiology

## 2015-07-22 DIAGNOSIS — I471 Supraventricular tachycardia: Secondary | ICD-10-CM | POA: Insufficient documentation

## 2015-07-22 HISTORY — PX: ELECTROPHYSIOLOGIC STUDY: SHX172A

## 2015-07-22 SURGERY — A-FLUTTER/A-TACH/SVT ABLATION
Anesthesia: LOCAL

## 2015-07-22 MED ORDER — SODIUM CHLORIDE 0.9 % IV SOLN
INTRAVENOUS | Status: DC
  Administered 2015-07-22: 07:00:00 via INTRAVENOUS

## 2015-07-22 MED ORDER — HEPARIN (PORCINE) IN NACL 2-0.9 UNIT/ML-% IJ SOLN
INTRAMUSCULAR | Status: AC
  Filled 2015-07-22: qty 500

## 2015-07-22 MED ORDER — ONDANSETRON HCL 4 MG/2ML IJ SOLN
INTRAMUSCULAR | Status: AC
  Filled 2015-07-22: qty 2

## 2015-07-22 MED ORDER — MIDAZOLAM HCL 5 MG/5ML IJ SOLN
INTRAMUSCULAR | Status: AC
  Filled 2015-07-22: qty 5

## 2015-07-22 MED ORDER — ACETAMINOPHEN 325 MG PO TABS
650.0000 mg | ORAL_TABLET | ORAL | Status: DC | PRN
  Administered 2015-07-22: 650 mg via ORAL
  Filled 2015-07-22: qty 2

## 2015-07-22 MED ORDER — ONDANSETRON HCL 4 MG/2ML IJ SOLN
4.0000 mg | Freq: Four times a day (QID) | INTRAMUSCULAR | Status: DC | PRN
  Administered 2015-07-22: 4 mg via INTRAVENOUS

## 2015-07-22 MED ORDER — BUPIVACAINE HCL (PF) 0.25 % IJ SOLN
INTRAMUSCULAR | Status: DC | PRN
  Administered 2015-07-22: 42 mL

## 2015-07-22 MED ORDER — ACETAMINOPHEN 325 MG PO TABS
ORAL_TABLET | ORAL | Status: AC
  Filled 2015-07-22: qty 2

## 2015-07-22 MED ORDER — SODIUM CHLORIDE 0.9 % IV SOLN
1.0000 mg | INTRAVENOUS | Status: DC | PRN
  Administered 2015-07-22: 2 ug/min via INTRAVENOUS

## 2015-07-22 MED ORDER — FENTANYL CITRATE (PF) 100 MCG/2ML IJ SOLN
INTRAMUSCULAR | Status: DC | PRN
  Administered 2015-07-22 (×2): 25 ug via INTRAVENOUS

## 2015-07-22 MED ORDER — SODIUM CHLORIDE 0.9% FLUSH: 3.0000 mL | Freq: Two times a day (BID) | INTRAVENOUS | Status: DC

## 2015-07-22 MED ORDER — HEPARIN (PORCINE) IN NACL 2-0.9 UNIT/ML-% IJ SOLN
INTRAMUSCULAR | Status: DC | PRN
  Administered 2015-07-22 (×2)

## 2015-07-22 MED ORDER — SODIUM CHLORIDE 0.9 % IV SOLN: 250.0000 mL | INTRAVENOUS | Status: DC | PRN

## 2015-07-22 MED ORDER — FENTANYL CITRATE (PF) 100 MCG/2ML IJ SOLN
INTRAMUSCULAR | Status: AC
Start: 2015-07-22 — End: 2015-07-22
  Filled 2015-07-22: qty 2

## 2015-07-22 MED ORDER — FLECAINIDE ACETATE 50 MG PO TABS: 75.0000 mg | ORAL_TABLET | Freq: Two times a day (BID) | ORAL | Status: DC

## 2015-07-22 MED ORDER — BUPIVACAINE HCL (PF) 0.25 % IJ SOLN
INTRAMUSCULAR | Status: AC
  Filled 2015-07-22: qty 60

## 2015-07-22 MED ORDER — MIDAZOLAM HCL 5 MG/5ML IJ SOLN
INTRAMUSCULAR | Status: DC | PRN
  Administered 2015-07-22: 2 mg via INTRAVENOUS
  Administered 2015-07-22 (×4): 1 mg via INTRAVENOUS

## 2015-07-22 MED ORDER — ISOPROTERENOL HCL 0.2 MG/ML IJ SOLN
INTRAMUSCULAR | Status: AC
  Filled 2015-07-22: qty 5

## 2015-07-22 MED ORDER — SODIUM CHLORIDE 0.9% FLUSH: 3.0000 mL | INTRAVENOUS | Status: DC | PRN

## 2015-07-22 SURGICAL SUPPLY — 11 items
BAG SNAP BAND KOVER 36X36 (MISCELLANEOUS) ×2 IMPLANT
CATH JOSEPHSON QUAD-ALLRED 6FR (CATHETERS) ×4 IMPLANT
CATH WEBSTER BI DIR CS D-F CRV (CATHETERS) ×2 IMPLANT
PACK EP LATEX FREE (CUSTOM PROCEDURE TRAY) ×1
PACK EP LF (CUSTOM PROCEDURE TRAY) ×1 IMPLANT
PAD DEFIB LIFELINK (PAD) ×2 IMPLANT
PATCH CARTO3 (PAD) ×2 IMPLANT
SHEATH PINNACLE 6F 10CM (SHEATH) ×4 IMPLANT
SHEATH PINNACLE 7F 10CM (SHEATH) ×2 IMPLANT
SHEATH PINNACLE 8F 10CM (SHEATH) ×2 IMPLANT
SHIELD RADPAD SCOOP 12X17 (MISCELLANEOUS) ×2 IMPLANT

## 2015-07-22 NOTE — Progress Notes (Signed)
Pt was placed on bedpan. Post use/ diarrhea pt states she cannot urinate, she is nauseated and tearful. Pressure held 15 minutes right groin. I/O foley cath placed. 400 cc clear urine. Zofran given. Pt resting now.

## 2015-07-22 NOTE — H&P (Signed)
Grace FearMary Elizabeth Goff-Plourd presents to the hospital with a history of SVT.  She has tried metoprolol without change in her symptoms.  Regular rhythm, no murmurs, lungs clear.  She presents for ablation.  Risks and benefits explained.  Risks include bleeding, tamponade, heart block, stroke.  Patient understands the risks and has agreed to the procedure.  Lilliona Blakeney Elberta Fortisamnitz, MD 07/22/2015 7:07 AM

## 2015-07-22 NOTE — Progress Notes (Signed)
Site area: rt groin Site Prior to Removal:  Level  0 Pressure Applied For: 15 minutes Manual:   yes Patient Status During Pull:  stable Post Pull Site:  Level  0 Post Pull Instructions Given:  yes Post Pull Pulses Present: yes Dressing Applied:  Small tegaderm   Bedrest begins @  Comments:   

## 2015-07-22 NOTE — Discharge Instructions (Signed)
Angiogram, Care After Refer to this sheet in the next few weeks. These instructions provide you with information about caring for yourself after your procedure. Your health care provider may also give you more specific instructions. Your treatment has been planned according to current medical practices, but problems sometimes occur. Call your health care provider if you have any problems or questions after your procedure. WHAT TO EXPECT AFTER THE PROCEDURE After your procedure, it is typical to have the following:  Bruising at the catheter insertion site that usually fades within 1-2 weeks.  Blood collecting in the tissue (hematoma) that may be painful to the touch. It should usually decrease in size and tenderness within 1-2 weeks. HOME CARE INSTRUCTIONS  Take medicines only as directed by your health care provider.  You may shower 24-48 hours after the procedure or as directed by your health care provider. Remove the bandage (dressing) and gently wash the site with plain soap and water. Pat the area dry with a clean towel. Do not rub the site, because this may cause bleeding.  Do not take baths, swim, or use a hot tub until your health care provider approves.  Check your insertion site every day for redness, swelling, or drainage.  Do not apply powder or lotion to the site.  Do not lift over 10 lb (4.5 kg) for 5 days after your procedure or as directed by your health care provider.  Ask your health care provider when it is okay to:  Return to work or school.  Resume usual physical activities or sports.  Resume sexual activity.  Do not drive home if you are discharged the same day as the procedure. Have someone else drive you.  You may drive 24 hours after the procedure unless otherwise instructed by your health care provider.  Do not operate machinery or power tools for 24 hours after the procedure or as directed by your health care provider.  If your procedure was done as an  outpatient procedure, which means that you went home the same day as your procedure, a responsible adult should be with you for the first 24 hours after you arrive home.  Keep all follow-up visits as directed by your health care provider. This is important. SEEK MEDICAL CARE IF:  You have a fever.  You have chills.  You have increased bleeding from the catheter insertion site. Hold pressure on the site 20 minutes or longer. MAY- call 911 if unable to stop SEEK IMMEDIATE MEDICAL CARE IF:  You have unusual pain at the catheter insertion site.  You have redness, warmth, or swelling at the catheter insertion site.  You have drainage (other than a small amount of blood on the dressing) from the catheter insertion site.  The catheter insertion site is bleeding, and the bleeding does not stop after 30 minutes of holding steady pressure on the site.  The area near or just beyond the catheter insertion site becomes pale, cool, tingly, or numb.   This information is not intended to replace advice given to you by your health care provider. Make sure you discuss any questions you have with your health care provider.   Document Released: 08/12/2004 Document Revised: 02/14/2014 Document Reviewed: 06/27/2012 Elsevier Interactive Patient Education [employer] Inc.

## 2015-07-22 NOTE — Progress Notes (Signed)
Patient is seen/examined by Dr. Elberta Fortisamnitz, VSS, procedure site stable, cleared for discharge once bed rest is completed.  Will increase her Flecainide to 75mg  BID with her metoprolol.  Activity restriction have been reviewed with the patient.  Francis Dowseenee Ursuy, PAc  Loman BrooklynWill Camnitz, MD

## 2015-07-22 NOTE — Progress Notes (Addendum)
Site area: lt groin Site Prior to Removal:  Level  0 Pressure Applied For:  15 minutes Manual:   yes Patient Status During Pull:  stable Post Pull Site:  Level  0 Post Pull Instructions Given:  yes Post Pull Pulses Present:  yes Dressing Applied:  Small tegaderm Bedrest begins @   1120 Comments:  IV saline locked

## 2015-07-23 ENCOUNTER — Encounter (HOSPITAL_COMMUNITY): Payer: Self-pay | Admitting: Cardiology

## 2015-07-23 MED FILL — Heparin Sodium (Porcine) 2 Unit/ML in Sodium Chloride 0.9%: INTRAMUSCULAR | Qty: 500 | Status: AC

## 2015-08-14 ENCOUNTER — Other Ambulatory Visit: Payer: Self-pay

## 2015-08-14 NOTE — Telephone Encounter (Signed)
metoprolol succinate (TOPROL-XL) 25 MG 24 hr tablet  Medication   Date: 06/15/2015  [facility] [address] [facility]  Ordering/Authorizing: Will Jorja LoaMartin Camnitz, MD      Order Providers    Prescribing Provider Encounter Provider   Will Jorja LoaMartin Camnitz, MD Valrie HartMindy L Isley, CMA    Medication Detail      Disp Refills Start End     metoprolol succinate (TOPROL-XL) 25 MG 24 hr tablet 30 tablet 11 06/15/2015     Sig - Route: Take 1 tablet (25 mg total) by mouth daily. - Oral    E-Prescribing Status: Receipt confirmed by pharmacy (06/15/2015 11:18 AM EDT)     Pharmacy    [id number] - [place], [place] - [address]     [facility] is requesting a 90 day supply. [facility] at [facility] call [phone] to move. Can change to 90 and 3

## 2015-08-14 NOTE — Telephone Encounter (Signed)
flecainide (TAMBOCOR) 50 MG tablet  Medication   Date: 07/22/2015  [facility]  Ordering/Authorizing: Sheilah Pigeonenee Lynn Ursuy, PA-C      Order Providers    Prescribing Provider Encounter Provider   Sheilah Pigeonenee Lynn Ursuy, PA-C None    Medication Detail      Disp Refills Start End     flecainide (TAMBOCOR) 50 MG tablet 90 tablet 3 07/22/2015     Sig - Route: Take 1.5 tablets (75 mg total) by mouth 2 (two) times daily. - Oral    Notes to Pharmacy: New medication    E-Prescribing Status: Receipt confirmed by pharmacy (07/22/2015 4:56 PM EDT)     Pharmacy    [id number] - [place], [place] - [address]  [facility] is requesting a 90 supply. [facility] call [phone] and move. Can change to 270 and 3 for 90 days supply

## 2015-08-24 NOTE — Progress Notes (Signed)
Electrophysiology Office Note   Date:  08/25/2015   ID:  Grace Tucker, DOB 1960-05-13, MRN [id number]  PCP:  No PCP Per Patient  Cardiologist:  Mayford Knife Primary Electrophysiologist:  Will Jorja Loa, MD    Chief Complaint  Patient presents with  . Follow-up    post SVT ablation     History of Present Illness: Grace Tucker is a 55 y.o. female who presents today for electrophysiology evaluation.   She presented to the hospital on 05/15/15 after an episode of syncope.  She was under quite a bit of stress around the time of her syncope.  Prior to the syncope, she began feeling a funny feeling in her chest with lightheadedness and weakness, then collapsed.  She had 2-3 similar episodes over the next 30 minutes.  In the ER, she had several runs of SVT in the 180s.  She was treated with metoprolol 5 mg x2.  Since being discharged from the hospital, she has had continued palpitations, but they have been much less severe.  Had negative EP study 07/22/15. Since then, she has continued to have occasional palpitations. The palpitations lasted up to a minute. She gets hot and sweaty, with a ringing in her ears. She says that after the palpitations are over, she feels fine without any complaint.  Today, she denies symptoms of palpitations, chest pain, shortness of breath, orthopnea, PND, lower extremity edema, claudication, dizziness, presyncope, syncope, bleeding, or neurologic sequela. The patient is tolerating medications without difficulties and is otherwise without complaint today.    Past Medical History  Diagnosis Date  . Asthma   . Syncope 05/16/2015    In setting of SVT   Past Surgical History  Procedure Laterality Date  . Appendectomy    . Cholecystectomy N/A 01/18/2013    Procedure: LAPAROSCOPIC CHOLECYSTECTOMY WITH INTRAOPERATIVE CHOLANGIOGRAM;  Surgeon: Velora Heckler, MD;  Location: [facility] OR;  Service: General;  Laterality: N/A;  . Electrophysiologic study  N/A 07/22/2015    Procedure: SVT Ablation;  Surgeon: Will Jorja Loa, MD;  Location: [facility] INVAS[facility] LAB;  Service: Cardiovascular;  Laterality: N/A;     Current Outpatient Prescriptions  Medication Sig Dispense Refill  . acetaminophen (TYLENOL) 500 MG tablet Take 1,000 mg by mouth every 6 (six) hours as needed for mild pain.    . flecainide (TAMBOCOR) 50 MG tablet Take 1.5 tablets (75 mg total) by mouth 2 (two) times daily. 90 tablet 3  . ibuprofen (ADVIL,MOTRIN) 200 MG tablet Take 200 mg by mouth every 6 (six) hours as needed for moderate pain.    . metoprolol succinate (TOPROL-XL) 25 MG 24 hr tablet Take 1 tablet (25 mg total) by mouth daily. 30 tablet 11  . ranitidine (ZANTAC) 150 MG capsule Take 150 mg by mouth daily as needed for heartburn.     No current facility-administered medications for this visit.    Allergies:   Ceclor   Social History:  The patient  reports that she has never smoked. She does not have any smokeless tobacco history on file. She reports that she does not drink alcohol or use illicit drugs.   Family History:  The patient's family history includes Colon cancer in her mother.    ROS:  Please see the history of present illness.   Otherwise, review of systems is positive for Palpitations.   All other systems are reviewed and negative.    PHYSICAL EXAM: VS:  BP 116/76 mmHg  Pulse 70  Ht  (1.651  m)  Wt 185 lb 12.8 oz (84.278 kg)  BMI 30.92 kg/m2 , BMI Body mass index is 30.92 kg/(m^2). GEN: Well nourished, well developed, in no acute distress HEENT: normal Neck: no JVD, carotid bruits, or masses Cardiac: RRR; no murmurs, rubs, or gallops,no edema  Respiratory:  clear to auscultation bilaterally, normal work of breathing GI: soft, nontender, nondistended, + BS MS: no deformity or atrophy Skin: warm and dry Neuro:  Strength and sensation are intact Psych: euthymic mood, full affect  EKG:  EKG is ordered today. The ekg ordered today shows sinus  rhythm, rate 70  Recent Labs: 05/15/2015: Magnesium 2.1; TSH 3.595 05/16/2015: ALT 13* 07/15/2015: BUN 12; Creat 0.78; Hemoglobin 14.2; Platelets 297; Potassium 4.3; Sodium 142    Lipid Panel  No results found for: CHOL, TRIG, HDL, CHOLHDL, VLDL, LDLCALC, LDLDIRECT   Wt Readings from Last 3 Encounters:  08/25/15 185 lb 12.8 oz (84.278 kg)  07/22/15 180 lb (81.647 kg)  07/15/15 182 lb 12.8 oz (82.918 kg)      Other studies Reviewed: Additional studies/ records that were reviewed today include: TTE 05/16/15  Review of the above records today demonstrates:  - Left ventricle: The cavity size was normal. Systolic function was  normal. The estimated ejection fraction was in the range of 50%  to 55%. Wall motion was normal; there were no regional wall  motion abnormalities. - Aortic valve: Trileaflet; normal thickness, mildly calcified  leaflets.   ASSESSMENT AND PLAN:  1.  SVT: Negative EP study 07/22/15. She is still having occasional palpitations, which last up to a minute. She is tolerating the flecainide well without any major complaints. Due to the increasing palpitations, we'll increase her flecainide to 100 mg twice daily. We will continue her metoprolol. She did have an episode of syncope on April 7. She will be able to drive on October 7.  Current medicines are reviewed at length with the patient today.   The patient does not have concerns regarding her medicines.  The following changes were made today: none  Proceduredered today include:  No orders of the defined types were placed in this encounter.     Disposition:   FU with Will Camnitz 6 months  Signed, Will Jorja LoaMartin Camnitz, MD  08/25/2015 10:49 AM     [facility] [address] [place] [address] [phone] (office) [phone] (fax)

## 2015-08-25 ENCOUNTER — Ambulatory Visit (INDEPENDENT_AMBULATORY_CARE_PROVIDER_SITE_OTHER): Payer: BLUE CROSS/BLUE SHIELD | Admitting: Cardiology

## 2015-08-25 ENCOUNTER — Encounter: Payer: Self-pay | Admitting: Cardiology

## 2015-08-25 ENCOUNTER — Encounter (INDEPENDENT_AMBULATORY_CARE_PROVIDER_SITE_OTHER): Payer: Self-pay

## 2015-08-25 VITALS — BP 116/76 | HR 70 | Ht 65.0 in | Wt 185.8 lb

## 2015-08-25 DIAGNOSIS — I471 Supraventricular tachycardia: Secondary | ICD-10-CM | POA: Diagnosis not present

## 2015-08-25 MED ORDER — FLECAINIDE ACETATE 100 MG PO TABS: 100.0000 mg | ORAL_TABLET | Freq: Two times a day (BID) | ORAL | Status: DC

## 2015-08-25 NOTE — Patient Instructions (Addendum)
Medication Instructions:  Your physician has recommended you make the following change in your medication:  1) INCREASE Flecainide to 100 mg twice daily  --- If you need a refill on your cardiac medications before your next appointment, please call your pharmacy. ---  Labwork: None  ordered  Testing/Procedures: None ordered  Follow-Up: Your physician wants you to follow-up in: 6 months with Dr. Elberta Fortisamnitz. You will receive a reminder letter in the mail two months in advance. If you don't receive a letter, please call our office to schedule the follow-up appointment.  Thank you for choosing [facility]!!   Dory HornSherri Darsha Zumstein, RN [phone]

## 2015-08-25 NOTE — Addendum Note (Signed)
Addended by: Baird LyonsPRICE, Geral Tuch L on: 08/25/2015 11:26 AM   Modules accepted: Orders

## 2015-08-31 ENCOUNTER — Other Ambulatory Visit: Payer: Self-pay | Admitting: *Deleted

## 2015-09-02 ENCOUNTER — Ambulatory Visit: Payer: BLUE CROSS/BLUE SHIELD | Admitting: Cardiology

## 2015-09-07 ENCOUNTER — Other Ambulatory Visit: Payer: Self-pay | Admitting: *Deleted

## 2015-09-07 MED ORDER — METOPROLOL SUCCINATE ER 25 MG PO TB24: 25.0000 mg | ORAL_TABLET | Freq: Every day | ORAL | 2 refills | Status: DC

## 2015-09-29 ENCOUNTER — Telehealth: Payer: Self-pay | Admitting: Cardiology

## 2015-09-29 ENCOUNTER — Encounter: Payer: Self-pay | Admitting: Cardiology

## 2015-09-29 MED ORDER — FLECAINIDE ACETATE 150 MG PO TABS: 75.0000 mg | ORAL_TABLET | Freq: Two times a day (BID) | ORAL | 3 refills | Status: DC

## 2015-09-29 NOTE — Telephone Encounter (Signed)
Pt returns my call.  She will come into tomorrow morning to see Dr. Elberta Fortisamnitz.

## 2015-09-29 NOTE — Telephone Encounter (Addendum)
Patient explains that she didn't start increased Flecainide dosage nor theToprol until a few weeks ago due to issues obtaining it from pharmacy. Since then she has been miserable - this feeling worsening over the last week.   Patient in tears during our conversation. Reports she cannot even put shoes on they are so swollen and is sleeping in a recliner elevating her legs - this improved edema slightly Reports bilateral pitting ankle edema, SOB and wt gain of 8 pds in 4 days. Denies any dietary changes or increase in sodium intake. States she usually walks 5-6 miles/day. Patient aware I will discuss this w/ pharmacist and Dr. Elberta Fortisamnitz and call her with recommendation/s. She is tearful but agreeable to plan.

## 2015-09-29 NOTE — Progress Notes (Signed)
Electrophysiology Office Note   Date:  09/30/2015   ID:  Grace Tucker, DOB 04/30/1960, MRN [id number]  PCP:  No PCP Per Patient  Cardiologist:  Mayford Knifeurner Primary Electrophysiologist:  Xzavier Swinger Jorja LoaMartin Kleigh Hoelzer, MD    Chief Complaint  Patient presents with  . Other    swelling, weight gain, SOB     History of Present Illness: Grace Tucker is a 55 y.o. female who presents today for electrophysiology evaluation.   She presented to the hospital on 05/15/15 after an episode of syncope.  She was under quite a bit of stress around the time of her syncope.  Prior to the syncope, she began feeling a funny feeling in her chest with lightheadedness and weakness, then collapsed.  She had 2-3 similar episodes over the next 30 minutes.  In the ER, she had several runs of SVT in the 180s.  She was treated with metoprolol 5 mg x2.  Since being discharged from the hospital, she has had continued palpitations, but they have been much less severe.  Had negative EP study 07/22/15. After her EP study, she continued to have palpitations and her flecainide was increased to 100 mg twice a day. She began to have significant weight gain, fatigue, and dizziness on that dose. She comes in today with pictures of her legs showing significant lower extremity edema. She says that she has gained 7-10 pounds, and has not had much of an appetite over that time.  Today, she denies symptoms of palpitations, chest pain, shortness of breath, orthopnea, PND, lower extremity edema, claudication, dizziness, presyncope, syncope, bleeding, or neurologic sequela. The patient is tolerating medications without difficulties and is otherwise without complaint today.    Past Medical History:  Diagnosis Date  . Asthma   . Syncope 05/16/2015   In setting of SVT   Past Surgical History:  Procedure Laterality Date  . APPENDECTOMY    . CHOLECYSTECTOMY N/A 01/18/2013   Procedure: LAPAROSCOPIC CHOLECYSTECTOMY WITH  INTRAOPERATIVE CHOLANGIOGRAM;  Surgeon: Velora Hecklerodd M Gerkin, MD;  [facility];  Service: General;  Laterality: N/A;  . ELECTROPHYSIOLOGIC STUDY N/A 07/22/2015   Procedure: SVT Ablation;  Surgeon: Bambie Pizzolato Jorja LoaMartin Randalyn Ahmed, MD;  Location: [facility] INV[facility] LAB;  Service: Cardiovascular;  Laterality: N/A;     Current Outpatient Prescriptions  Medication Sig Dispense Refill  . acetaminophen (TYLENOL) 500 MG tablet Take 1,000 mg by mouth every 6 (six) hours as needed for mild pain.    . flecainide (TAMBOCOR) 50 MG tablet Take 1 tablet (50 mg total) by mouth 2 (two) times daily. 60 tablet 3  . ibuprofen (ADVIL,MOTRIN) 200 MG tablet Take 200 mg by mouth every 6 (six) hours as needed for moderate pain.    . metoprolol succinate (TOPROL-XL) 25 MG 24 hr tablet Take 1 tablet (25 mg total) by mouth daily. 90 tablet 2  . ranitidine (ZANTAC) 150 MG capsule Take 150 mg by mouth daily as needed for heartburn.    . furosemide (LASIX) 20 MG tablet Take as directed 10 tablet 0   No current facility-administered medications for this visit.     Allergies:   Ceclor [cefaclor]   Social History:  The patient  reports that she has never smoked. She has never used smokeless tobacco. She reports that she does not drink alcohol or use drugs.   Family History:  The patient's family history includes Colon cancer in her mother.    ROS:  Please see the history of present illness.   Otherwise, review  of systems is positive for weight gain, leg pain and swelling, DOE.   All other systems are reviewed and negative.    PHYSICAL EXAM: VS:  BP 120/82   Pulse (!) 59   Ht 5\' 5"  (1.651 m)   Wt 185 lb 9.6 oz (84.2 kg)   BMI 30.89 kg/m  , BMI Body mass index is 30.89 kg/m. GEN: Well nourished, well developed, in no acute distress  HEENT: normal  Neck: no JVD, carotid bruits, or masses Cardiac: RRR; no murmurs, rubs, or gallops, 1+ edema  Respiratory:  clear to auscultation bilaterally, normal work of breathing GI: soft,  nontender, nondistended, + BS MS: no deformity or atrophy  Skin: warm and dry Neuro:  Strength and sensation are intact Psych: euthymic mood, full affect  EKG:  EKG is ordered today. Personal review of the ekg ordered today shows sinus rhythm, rate 59   Recent Labs: 05/15/2015: Magnesium 2.1; TSH 3.595 05/16/2015: ALT 13 07/15/2015: BUN 12; Creat 0.78; Hemoglobin 14.2; Platelets 297; Potassium 4.3; Sodium 142    Lipid Panel  No results found for: CHOL, TRIG, HDL, CHOLHDL, VLDL, LDLCALC, LDLDIRECT   Wt Readings from Last 3 Encounters:  09/30/15 185 lb 9.6 oz (84.2 kg)  08/25/15 185 lb 12.8 oz (84.3 kg)  07/22/15 180 lb (81.6 kg)      Other studies Reviewed: Additional studies/ records that were reviewed today include: TTE 05/16/15  Review of the above records today demonstrates:  - Left ventricle: The cavity size was normal. Systolic function was  normal. The estimated ejection fraction was in the range of 50%  to 55%. Wall motion was normal; there were no regional wall  motion abnormalities. - Aortic valve: Trileaflet; normal thickness, mildly calcified  leaflets.   ASSESSMENT AND PLAN:  1.  SVT: Negative EP study 07/22/15. She is still having occasional palpitations, which last up to a minute. Flecainide increased at the last visit. She is having significant fatigue and weakness, and therefore we'll decrease her flecainide to 50 mg a day.  2. Lower extremity edema: She has pictures of significant lower extremity edema at the end of the day. She has only 1+ to trace edema today, but she has been gaining weight. Due to her significant edema, Kresha Abelson give her 4 days of Lasix at 20 mg a day. We'll also check her creatinine today. I'll see her back in 6 weeks to see if she is feeling better. Current medicines are reviewed at length with the patient today.   The patient does not have concerns regarding her medicines.  The following changes were made today: decrease flecainide, start  lasix and  Proceduredered today include:  Orders Placed This Encounter  Procedures  . Basic metabolic panel  . EKG 12-Lead     Disposition:   FU with Dal Blew 6 weeks  Signed, Taylia Berber Jorja LoaMartin Lilian Fuhs, MD  09/30/2015 9:35 AM     [facility] [address] [place] [address] is [phone] (fax)

## 2015-09-29 NOTE — Telephone Encounter (Signed)
Discussed w/ Camnitz. Recent echo in April not concerning. Called patient and discussed that issues shouldn't be related to medication, but will decrease Flecainide to 75 mg to see if improvement is made. Offered patient appt tomorrow morning w/ Camnitz but she is unsure if she can make it.  She is going to call me back with decision of appt.

## 2015-09-29 NOTE — Telephone Encounter (Signed)
New message   Pt c/o swelling: STAT is pt has developed SOB within 24 hours  1. How long have you been experiencing swelling? 2 weeks  2. Where is the swelling located? Legs and ankles 3.  Are you currently taking a "fluid pill"?  no  4.  Are you currently SOB? no  5.  Have you traveled recently? no  metoprolol and flecainide

## 2015-09-30 ENCOUNTER — Encounter (INDEPENDENT_AMBULATORY_CARE_PROVIDER_SITE_OTHER): Payer: Self-pay

## 2015-09-30 ENCOUNTER — Ambulatory Visit (INDEPENDENT_AMBULATORY_CARE_PROVIDER_SITE_OTHER): Payer: BLUE CROSS/BLUE SHIELD | Admitting: Cardiology

## 2015-09-30 ENCOUNTER — Encounter: Payer: Self-pay | Admitting: Cardiology

## 2015-09-30 VITALS — BP 120/82 | HR 59 | Ht 65.0 in | Wt 185.6 lb

## 2015-09-30 DIAGNOSIS — R600 Localized edema: Secondary | ICD-10-CM | POA: Diagnosis not present

## 2015-09-30 DIAGNOSIS — R635 Abnormal weight gain: Secondary | ICD-10-CM

## 2015-09-30 DIAGNOSIS — R0602 Shortness of breath: Secondary | ICD-10-CM | POA: Diagnosis not present

## 2015-09-30 LAB — BASIC METABOLIC PANEL
BUN: 16 mg/dL (ref 7–25)
CO2: 26 mmol/L (ref 20–31)
CREATININE: 0.87 mg/dL (ref 0.50–1.05)
Calcium: 9.4 mg/dL (ref 8.6–10.4)
Chloride: 107 mmol/L (ref 98–110)
Glucose, Bld: 87 mg/dL (ref 65–99)
Potassium: 4.5 mmol/L (ref 3.5–5.3)
Sodium: 142 mmol/L (ref 135–146)

## 2015-09-30 MED ORDER — FLECAINIDE ACETATE 50 MG PO TABS: 50.0000 mg | ORAL_TABLET | Freq: Two times a day (BID) | ORAL | 3 refills | Status: DC

## 2015-09-30 MED ORDER — FUROSEMIDE 20 MG PO TABS: ORAL_TABLET | ORAL | 0 refills | Status: DC

## 2015-09-30 NOTE — Patient Instructions (Signed)
Medication Instructions:  Your physician has recommended you make the following change in your medication:  1) DECREASE Flecainide to 50 mg twice a day. 2) TAKE Lasix 20 mg once daily  -- take this for 3 days and then stop  Labwork: BMET  Testing/Procedures: None  ordered  Follow-Up: Your physician recommends that you schedule a follow-up appointment in: 6 weeks with Dr. Elberta Fortisamnitz.   If you need a refill on your cardiac medications before your next appointment, please call your pharmacy.  Thank you for choosing [facility]!!   Dory HornSherri Kellyjo Edgren, RN [phone]

## 2015-10-09 ENCOUNTER — Telehealth: Payer: Self-pay | Admitting: *Deleted

## 2015-10-09 NOTE — Telephone Encounter (Signed)
Called to check on patient. She reports BLEE has improved while she was on Lasix.  But states edema started to return in ankles/feet after 3 day course of Lasix was completed.  Explains that edema isn't as bad as it was, but it is returning.  She is elevating lower extremities when possible. Advised to take 3 more days of Lasix.  She is increasing her potassium intake with taking this medication. She will monitor over the weekend and we will touch base beginning of next week to determine if improvement occurs. She understands I will also review case with Camnitz next week to determine new plan of care. She thanks me for calling and checking in with her.

## 2015-10-15 NOTE — Telephone Encounter (Signed)
Reviewed w/ Camnitz -- order to stop Flecainide. Advised patient who is agreeable.  She will keep monitoring and call if HR increases.  Otherwise she will keep scheduled follow up next month. She is agreeable to plan and thanks me for helping.

## 2015-10-16 NOTE — Addendum Note (Signed)
Addended by: Baird LyonsPRICE, Duy Lemming L on: 10/16/2015 04:05 PM   Modules accepted: Orders

## 2015-11-16 ENCOUNTER — Ambulatory Visit (INDEPENDENT_AMBULATORY_CARE_PROVIDER_SITE_OTHER): Payer: BLUE CROSS/BLUE SHIELD | Admitting: Cardiology

## 2015-11-16 ENCOUNTER — Encounter: Payer: Self-pay | Admitting: Cardiology

## 2015-11-16 ENCOUNTER — Encounter (INDEPENDENT_AMBULATORY_CARE_PROVIDER_SITE_OTHER): Payer: Self-pay

## 2015-11-16 VITALS — BP 126/90 | HR 72 | Ht 65.0 in | Wt 187.2 lb

## 2015-11-16 DIAGNOSIS — I471 Supraventricular tachycardia: Secondary | ICD-10-CM

## 2015-11-16 MED ORDER — DILTIAZEM HCL ER COATED BEADS 180 MG PO CP24: 180.0000 mg | ORAL_CAPSULE | Freq: Every day | ORAL | 3 refills | Status: DC

## 2015-11-16 MED ORDER — DILTIAZEM HCL ER COATED BEADS 180 MG PO CP24: 180.0000 mg | ORAL_CAPSULE | Freq: Every day | ORAL | 0 refills | Status: DC

## 2015-11-16 MED ORDER — DILTIAZEM HCL 60 MG PO TABS: 60.0000 mg | ORAL_TABLET | Freq: Every day | ORAL | 3 refills | Status: DC | PRN

## 2015-11-16 NOTE — Progress Notes (Signed)
Electrophysiology Office Note   Date:  11/16/2015   ID:  [name], DOB 23-Jan-1961, MRN [id number]  PCP:  No PCP Per Patient  Cardiologist:  Mayford Knife Primary Electrophysiologist:  Madina Galati Jorja Loa, MD    Chief Complaint  Patient presents with  . Follow-up    SVT     History of Present Illness: Grace Tucker is a 55 y.o. female who presents today for electrophysiology evaluation.   She presented to the hospital on 05/15/15 after an episode of syncope.  She was under quite a bit of stress around the time of her syncope.  Prior to the syncope, she began feeling a funny feeling in her chest with lightheadedness and weakness, then collapsed.  She had 2-3 similar episodes over the next 30 minutes.  In the ER, she had several runs of SVT in the 180s.  She was treated with metoprolol 5 mg x2.  Since being discharged from the hospital, she has had continued palpitations, but they have been much less severe.  Had negative EP study 07/22/15. She is continuing to have episodes of palpitations that are also associated with a very forceful heartbeat. She says that she had a very bad day on Saturday with continued episodes of palpitations and shortness of breath.  Today, she denies symptoms of chest pain, shortness of breath, orthopnea, PND, lower extremity edema, claudication, dizziness, presyncope, syncope, bleeding, or neurologic sequela. The patient is tolerating medications without difficulties and is otherwise without complaint today.    Past Medical History:  Diagnosis Date  . Asthma   . Syncope 05/16/2015   In setting of SVT   Past Surgical History:  Procedure Laterality Date  . APPENDECTOMY    . CHOLECYSTECTOMY N/A 01/18/2013   Procedure: LAPAROSCOPIC CHOLECYSTECTOMY WITH INTRAOPERATIVE CHOLANGIOGRAM;  Surgeon: Velora Heckler, MD;  Location: [facility] OR;  Service: General;  Laterality: N/A;  . ELECTROPHYSIOLOGIC STUDY N/A 07/22/2015   Procedure: SVT Ablation;   Surgeon: Bleu Moisan Jorja Loa, MD;  [facility];  Service: Cardiovascular;  Laterality: N/A;     Current Outpatient Prescriptions  Medication Sig Dispense Refill  . acetaminophen (TYLENOL) 500 MG tablet Take 1,000 mg by mouth every 6 (six) hours as needed for mild pain.    . furosemide (LASIX) 20 MG tablet Take as directed 10 tablet 0  . ibuprofen (ADVIL,MOTRIN) 200 MG tablet Take 200 mg by mouth every 6 (six) hours as needed for moderate pain.    . ranitidine (ZANTAC) 150 MG capsule Take 150 mg by mouth daily as needed for heartburn.    . diltiazem (CARDIZEM CD) 180 MG 24 hr capsule Take 1 capsule (180 mg total) by mouth daily. 90 capsule 3  . diltiazem (CARDIZEM) 60 MG tablet Take 1 tablet (60 mg total) by mouth daily as needed. For increased heart rate. 15 tablet 3   No current facility-administered medications for this visit.     Allergies:   Ceclor [cefaclor]   Social History:  The patient  reports that she has never smoked. She has never used smokeless tobacco. She reports that she does not drink alcohol or use drugs.   Family History:  The patient's family history includes Colon cancer in her mother.    ROS:  Please see the history of present illness.   Otherwise, review of systems is positive for weight change, leg swelling, fatigue, palpitations.   All other systems are reviewed and negative.    PHYSICAL EXAM: VS:  BP 126/90  Pulse 72   Ht 5\' 5"  (1.651 m)   Wt 187 lb 3.2 oz (84.9 kg)   BMI 31.15 kg/m  , BMI Body mass index is 31.15 kg/m. GEN: Well nourished, well developed, in no acute distress  HEENT: normal  Neck: no JVD, carotid bruits, or masses Cardiac: RRR; no murmurs, rubs, or gallops, 1+ edema  Respiratory:  clear to auscultation bilaterally, normal work of breathing GI: soft, nontender, nondistended, + BS MS: no deformity or atrophy  Skin: warm and dry Neuro:  Strength and sensation are intact Psych: euthymic mood, full affect  EKG:  EKG  is ordered today. Personal review of the ekg ordered 09/30/15 shows sinus rhythm, rate 59  Recent Labs: 05/15/2015: Magnesium 2.1; TSH 3.595 05/16/2015: ALT 13 07/15/2015: Hemoglobin 14.2; Platelets 297 09/30/2015: BUN 16; Creat 0.87; Potassium 4.5; Sodium 142    Lipid Panel  No results found for: CHOL, TRIG, HDL, CHOLHDL, VLDL, LDLCALC, LDLDIRECT   Wt Readings from Last 3 Encounters:  11/16/15 187 lb 3.2 oz (84.9 kg)  09/30/15 185 lb 9.6 oz (84.2 kg)  08/25/15 185 lb 12.8 oz (84.3 kg)      Other studies Reviewed: Additional studies/ records that were reviewed today include: TTE 05/16/15  Review of the above records today demonstrates:  - Left ventricle: The cavity size was normal. Systolic function was  normal. The estimated ejection fraction was in the range of 50%  to 55%. Wall motion was normal; there were no regional wall  motion abnormalities. - Aortic valve: Trileaflet; normal thickness, mildly calcified  leaflets.   ASSESSMENT AND PLAN:  1.  SVT: Negative EP study 07/22/15. She is still having occasional palpitations, which last up to a minute. We'll stop her Toprol and start her on diltiazem 180 mg daily as well as 60 mg of short acting as needed. I have told her that there is still an option for ablation. She would like to consider this and Erica Osuna call us back.  2. Lower extremity edema: Has improved since stopping her flecainide. I have told her that if she has increased swelling, that she can take an extra dose of her Lasix.  Current medicines are reviewed at length with the patient today.   The patient does not have concerns regarding her medicines.  The following changes were made today: Stop metoprolol start diltiazem  Proceduredered today include:  No orders of the defined types were placed in this encounter.    Disposition:   FU with Robt Okuda 6 weeks  Signed, Klay Sobotka Jorja LoaMartin Jamarkis Branam, MD  11/16/2015 10:09 AM     [facility] [address] [place] [address] is [phone] (fax)

## 2015-11-16 NOTE — Patient Instructions (Signed)
Medication Instructions:    Your physician has recommended you make the following change in your medication:   1) STOP Metoprolol  2) START Dilitazem 180 mg once daily  3) START Diltiazem 60 mg once daily as needed for increased heart rates  --- If you need a refill on your cardiac medications before your next appointment, please call your pharmacy. ---  Labwork:  None ordered  Testing/Procedures:  None ordered  Follow-Up:  Your physician recommends that you schedule a follow-up appointment in: 3 months with Dr. Elberta Fortis.  Thank you for choosing [facility]!!   Dory Horn, RN [phone]   Any Other Special Instructions Will Be Listed Below (If Applicable). Diltiazem extended-release capsules or tablets What is this medicine? DILTIAZEM (dil TYE a zem) is a calcium-channel blocker. It affects the amount of calcium found in your heart and muscle cells. This relaxes your blood vessels, which can reduce the amount of work the heart has to do. This medicine is used to treat high blood pressure and chest pain caused by angina. This medicine may be used for other purposes; ask your health care provider or pharmacist if you have questions. What should I tell my health care provider before I take this medicine? They need to know if you have any of these conditions: -heart problems, low blood pressure, irregular heartbeat -liver disease -previous heart attack -an unusual or allergic reaction to diltiazem, other medicines, foods, dyes, or preservatives -pregnant or trying to get pregnant -breast-feeding How should I use this medicine? Take this medicine by mouth with a glass of water. Follow the directions on the prescription label. Swallow whole, do not crush or chew. Ask your doctor or pharmacist if your should take this medicine with food. Take your doses at regular intervals. Do not take your medicine more often then directed. Do not stop taking except on the advice of your  doctor or health care professional. Ask your doctor or health care professional how to gradually reduce the dose. Talk to your pediatrician regarding the use of this medicine in children. Special care may be needed. Overdosage: If you think you have taken too much of this medicine contact a poison control center or emergency room at once. NOTE: This medicine is only for you. Do not share this medicine with others. What if I miss a dose? If you miss a dose, take it as soon as you can. If it is almost time for your next dose, take only that dose. Do not take double or extra doses. What may interact with this medicine? Do not take this medicine with any of the following medications: -cisapride -hawthorn -pimozide -ranolazine -red yeast rice This medicine may also interact with the following medications: -buspirone -carbamazepine -cimetidine -cyclosporine -digoxin -local anesthetics or general anesthetics -lovastatin -medicines for anxiety or difficulty sleeping like midazolam and triazolam -medicines for high blood pressure or heart problems -quinidine -rifampin, rifabutin, or rifapentine This list may not describe all possible interactions. Give your health care provider a list of all the medicines, herbs, non-prescription drugs, or dietary supplements you use. Also tell them if you smoke, drink alcohol, or use illegal drugs. Some items may interact with your medicine. What should I watch for while using this medicine? Check your blood pressure and pulse rate regularly. Ask your doctor or health care professional what your blood pressure and pulse rate should be and when you should contact him or her. You may feel dizzy or lightheaded. Do not drive, use machinery, or  do anything that needs mental alertness until you know how this medicine affects you. To reduce the risk of dizzy or fainting spells, do not sit or stand up quickly, especially if you are an older patient. Alcohol can make you  more dizzy or increase flushing and rapid heartbeats. Avoid alcoholic drinks. What side effects may I notice from receiving this medicine? Side effects that you should report to your doctor or health care professional as soon as possible: -allergic reactions like skin rash, itching or hives, swelling of the face, lips, or tongue -confusion, mental depression -feeling faint or lightheaded, falls -redness, blistering, peeling or loosening of the skin, including inside the mouth -slow, irregular heartbeat -swelling of the feet and ankles -unusual bleeding or bruising, pinpoint red spots on the skin Side effects that usually do not require medical attention (report to your doctor or health care professional if they continue or are bothersome): -constipation or diarrhea -difficulty sleeping -facial flushing -headache -nausea, vomiting -sexual dysfunction -weak or tired This list may not describe all possible side effects. Call your doctor for medical advice about side effects. You may report side effects to FDA at 1-800-FDA-1088. Where should I keep my medicine? Keep out of the reach of children. Store at room temperature between 15 and 30 degrees C (59 and 86 degrees F). Protect from humidity. Throw away any unused medicine after the expiration date. NOTE: This sheet is a summary. It may not cover all possible information. If you have questions about this medicine, talk to your doctor, pharmacist, or health care provider.    2016, Elsevier/Gold Standard. (2007-05-17 14:35:47)

## 2016-01-09 ENCOUNTER — Other Ambulatory Visit: Payer: Self-pay | Admitting: Cardiology

## 2016-04-15 ENCOUNTER — Other Ambulatory Visit: Payer: Self-pay | Admitting: Cardiology

## 2016-07-14 ENCOUNTER — Encounter (HOSPITAL_COMMUNITY): Payer: Self-pay | Admitting: Emergency Medicine

## 2016-07-14 ENCOUNTER — Ambulatory Visit (HOSPITAL_COMMUNITY)
Admission: EM | Admit: 2016-07-14 | Discharge: 2016-07-14 | Disposition: A | Discharge status: Home / Self Care | Payer: BLUE CROSS/BLUE SHIELD | Attending: Internal Medicine | Admitting: Internal Medicine

## 2016-07-14 DIAGNOSIS — L0889 Other specified local infections of the skin and subcutaneous tissue: Secondary | ICD-10-CM

## 2016-07-14 DIAGNOSIS — W57XXXA Bitten or stung by nonvenomous insect and other nonvenomous arthropods, initial encounter: Secondary | ICD-10-CM

## 2016-07-14 DIAGNOSIS — S80861A Insect bite (nonvenomous), right lower leg, initial encounter: Secondary | ICD-10-CM | POA: Diagnosis not present

## 2016-07-14 MED ORDER — AMOXICILLIN-POT CLAVULANATE 875-125 MG PO TABS: 1.0000 | ORAL_TABLET | Freq: Two times a day (BID) | ORAL | 0 refills | Status: DC

## 2016-07-14 NOTE — ED Provider Notes (Signed)
CSN: [id number]     Arrival date & time 07/14/16  1341 History   First MD Initiated Contact with Patient 07/14/16 1410     Chief Complaint  Patient presents with  . Insect Bite   (Consider location/radiation/quality/duration/timing/severity/associated sxs/prior Treatment) 56-year-old female states she received an insect bite to the right anterior shin on May 28. She was in [place] at time. She did not think too much about it, as there was some itching and initial erythema. Seem to maintain this state for a few days until 2-3 days ago it became sore, raised a little above the skin and now tender, a little more red with a small area of underlying induration. Patient states since that his happened she has felt a little fatigue and having some headache. No neck pain or stiffness.      Past Medical History:  Diagnosis Date  . Asthma   . Syncope 05/16/2015   In setting of SVT   Past Surgical History:  Procedure Laterality Date  . APPENDECTOMY    . CHOLECYSTECTOMY N/A 01/18/2013   Procedure: LAPAROSCOPIC CHOLECYSTECTOMY WITH INTRAOPERATIVE CHOLANGIOGRAM;  Surgeon: Velora Heckler, MD;  Location: [facility] OR;  Service: General;  Laterality: N/A;  . ELECTROPHYSIOLOGIC STUDY N/A 07/22/2015   Procedure: SVT Ablation;  Surgeon: Will Jorja Loa, MD;  Location: [facility];  Service: Cardiovascular;  Laterality: N/A;   Family History  Problem Relation Age of Onset  . Colon cancer Mother        colon   Social History  Substance Use Topics  . Smoking status: Never Smoker  . Smokeless tobacco: Never Used  . Alcohol use No   OB History    No data available     Review of Systems  Constitutional: Positive for fatigue. Negative for fever.  HENT: Negative.   Respiratory: Negative.   Gastrointestinal: Negative.   Skin: Positive for wound.  Neurological: Negative.   All other systems reviewed and are negative.   Allergies  Flecainide and Ceclor [cefaclor]  Home  Medications   Prior to Admission medications   Medication Sig Start Date End Date Taking? Authorizing Provider  diltiazem (CARDIZEM CD) 180 MG 24 hr capsule Take 1 capsule (180 mg total) by mouth daily. *Please call and schedule an appointment* 04/18/16 07/17/16 Yes Camnitz, Andree Coss, MD  acetaminophen (TYLENOL) 500 MG tablet Take 1,000 mg by mouth every 6 (six) hours as needed for mild pain.    [provider]  diltiazem (CARDIZEM CD) 180 MG 24 hr capsule Take 1 capsule (180 mg total) by mouth daily. 11/16/15 02/14/16  Camnitz, Andree Coss, MD  diltiazem (CARDIZEM) 60 MG tablet Take 1 tablet (60 mg total) by mouth daily as needed. For increased heart rate. 11/16/15   Camnitz, Andree Coss, MD  furosemide (LASIX) 20 MG tablet Take as directed 09/30/15   Camnitz, Andree Coss, MD  ibuprofen (ADVIL,MOTRIN) 200 MG tablet Take 200 mg by mouth every 6 (six) hours as needed for moderate pain.    [provider]  ranitidine (ZANTAC) 150 MG capsule Take 150 mg by mouth daily as needed for heartburn.    [provider]   Meds Ordered and Administered this Visit  Medications - No data to display  BP 124/76 (BP Location: Left Arm)   Pulse 72   Temp 98.2 F (36.8 C) (Oral)   Resp 20   SpO2 99%  No data found.   Physical Exam  Constitutional: She is oriented to person,  place, and time. She appears well-developed and well-nourished. No distress.  Neck: Neck supple.  Pulmonary/Chest: Effort normal.  Musculoskeletal: Normal range of motion. She exhibits no edema or deformity.  Neurological: She is alert and oriented to person, place, and time.  Skin: Skin is warm and dry.  Approximately 2-1/2-3 cm diameter area of erythema with central raised area and approximately 2 cm of underlying induration. The area is tender. No current drainage can be seen on the exam however the patient did say she noticed a small amount of drainage home the clothing. No lymphangitis.  Psychiatric: She  has a normal mood and affect.  Nursing note and vitals reviewed.   Urgent Care Course     Procedures (including critical care time)  Labs Review Labs Reviewed - No data to display  Imaging Review No results found.   Visual Acuity Review  Right Eye Distance:   Left Eye Distance:   Bilateral Distance:    Right Eye Near:   Left Eye Near:    Bilateral Near:         MDM   1. Insect bite, initial encounter   2. Secondary infection of skin    This appears to be a secondary bacterial infection over the insect bite. He will be treated with Keflex 4 times a day. This  is an antibiotic. Also apply warm compresses 2-3 times a day to help with healing and possibly bring the lesion to a head and drained. For any worsening, red streaks or increased pain or size despite being on antibiotics return promptly. Meds ordered this encounter  Medications  . amoxicillin-clavulanate (AUGMENTIN) 875-125 MG tablet    Sig: Take 1 tablet by mouth every 12 (twelve) hours.    Dispense:  14 tablet    Refill:  0    Order Specific Question:   Supervising Provider    Answer:   Eustace MooreMURRAY, LAURA W [[id number]]        Hayden RasmussenMabe, Jalexia Lalli, NP 07/14/16 1439

## 2016-07-14 NOTE — ED Notes (Signed)
Bed: UC04 Expected date:  Expected time:  Means of arrival:  Comments: Held

## 2016-07-14 NOTE — ED Triage Notes (Signed)
Pt reports poss spider bite to RLE onset 5/28  Sx today include redness, tender and swelling  Denies fevers, chills  A&O x4... NAD... Ambulatory

## 2016-07-14 NOTE — Discharge Instructions (Signed)
This appears to be a secondary bacterial infection over the insect bite. He will be treated with Keflex 4 times a day. This  is an antibiotic. Also apply warm compresses 2-3 times a day to help with healing and possibly bring the lesion to a head and drained. For any worsening, red streaks or increased pain or size despite being on antibiotics return promptly.

## 2016-08-30 ENCOUNTER — Encounter (INDEPENDENT_AMBULATORY_CARE_PROVIDER_SITE_OTHER): Payer: Self-pay

## 2016-08-30 ENCOUNTER — Encounter: Payer: Self-pay | Admitting: Cardiology

## 2016-08-30 ENCOUNTER — Ambulatory Visit (INDEPENDENT_AMBULATORY_CARE_PROVIDER_SITE_OTHER): Payer: BLUE CROSS/BLUE SHIELD | Admitting: Cardiology

## 2016-08-30 VITALS — BP 120/70 | HR 75 | Ht 65.0 in | Wt 169.4 lb

## 2016-08-30 DIAGNOSIS — I471 Supraventricular tachycardia: Secondary | ICD-10-CM

## 2016-08-30 NOTE — Patient Instructions (Addendum)
Medication Instructions:  Your physician recommends that you continue on your current medications as directed. Please refer to the Current Medication list given to you today.  If you need a refill on your cardiac medications before your next appointment, please call your pharmacy.   Labwork: None ordered  Testing/Procedures: None ordered  Follow-Up: Your physician wants you to follow-up in: 6 months  with Dr. Camnitz.  You will receive a reminder letter in the mail two months in advance. If you don't receive a letter, please call our office to schedule the follow-up appointment.  Thank you for choosing [facility]!!   Sherri Price, RN [phone]  Any Other Special Instructions Will Be Listed Below (If Applicable).        

## 2016-08-30 NOTE — Progress Notes (Signed)
Electrophysiology Office Note   Date:  08/30/2016   ID:  Grace Tucker, DOB 1960/10/09, MRN [id number]  PCP:  Patient, No Pcp Per  Cardiologist:  Mayford Knifeurner Primary Electrophysiologist:  Khylee Algeo Jorja LoaMartin Perel Hauschild, MD    Chief Complaint  Patient presents with  . Palpitations     History of Present Illness: Grace Tucker is a 56 y.o. female who presents today for electrophysiology evaluation.   She presented to the hospital on 05/15/15 after an episode of syncope.  She was under quite a bit of stress around the time of her syncope.  Prior to the syncope, she began feeling a funny feeling in her chest with lightheadedness and weakness, then collapsed.  She had 2-3 similar episodes over the next 30 minutes.  In the ER, she had several runs of SVT in the 180s.  She was treated with metoprolol 5 mg x2.  Had negative EP study 07/22/15. Over the last month, she has had more severe palpitations. Last Sunday, she had palpitations for a few hours on and off. She took her diltiazem, and her palpitations stopped occurring at about 4:30 in the afternoon. She did consider going to the emergency room. After the palpitations stopped, she felt quite exhausted.  Today, denies symptoms of palpitations, chest pain, shortness of breath, orthopnea, PND, lower extremity edema, claudication, dizziness, presyncope, syncope, bleeding, or neurologic sequela. The patient is tolerating medications without difficulties and is otherwise without complaint today.   Past Medical History:  Diagnosis Date  . Asthma   . Syncope 05/16/2015   In setting of SVT   Past Surgical History:  Procedure Laterality Date  . APPENDECTOMY    . CHOLECYSTECTOMY N/A 01/18/2013   Procedure: LAPAROSCOPIC CHOLECYSTECTOMY WITH INTRAOPERATIVE CHOLANGIOGRAM;  Surgeon: Velora Hecklerodd M Gerkin, MD;  [facility];  Service: General;  Laterality: N/A;  . ELECTROPHYSIOLOGIC STUDY N/A 07/22/2015   Procedure: SVT Ablation;  Surgeon: Ina Scrivens Jorja LoaMartin  Nyliah Nierenberg, MD;  Location: [facility];  Service: Cardiovascular;  Laterality: N/A;     Current Outpatient Prescriptions  Medication Sig Dispense Refill  . acetaminophen (TYLENOL) 500 MG tablet Take 1,000 mg by mouth every 6 (six) hours as needed for mild pain.    Marland Kitchen. amoxicillin-clavulanate (AUGMENTIN) 875-125 MG tablet Take 1 tablet by mouth every 12 (twelve) hours. 14 tablet 0  . diltiazem (CARDIZEM CD) 180 MG 24 hr capsule Take 1 capsule (180 mg total) by mouth daily. 90 capsule 3  . diltiazem (CARDIZEM) 60 MG tablet Take 1 tablet (60 mg total) by mouth daily as needed. For increased heart rate. 15 tablet 3  . furosemide (LASIX) 20 MG tablet Take as directed 10 tablet 0  . ibuprofen (ADVIL,MOTRIN) 200 MG tablet Take 200 mg by mouth every 6 (six) hours as needed for moderate pain.    . ranitidine (ZANTAC) 150 MG capsule Take 150 mg by mouth daily as needed for heartburn.     No current facility-administered medications for this visit.     Allergies:   Flecainide and Ceclor [cefaclor]   Social History:  The patient  reports that she has never smoked. She has never used smokeless tobacco. She reports that she does not drink alcohol or use drugs.   Family History:  The patient's family history includes Colon cancer in her mother.    ROS:  Please see the history of present illness.   Otherwise, review of systems is positive for none.   All other systems are reviewed and negative.  PHYSICAL EXAM: VS:  There were no vitals taken for this visit. , BMI There is no height or weight on file to calculate BMI. GEN: Well nourished, well developed, in no acute distress  HEENT: normal  Neck: no JVD, carotid bruits, or masses Cardiac: RRR; no murmurs, rubs, or gallops,no edema  Respiratory:  clear to auscultation bilaterally, normal work of breathing GI: soft, nontender, nondistended, + BS MS: no deformity or atrophy  Skin: warm and dry Neuro:  Strength and sensation are intact Psych:  euthymic mood, full affect  EKG:  EKG is ordered today. Personal review of the ekg ordered shows SR, LAE, rate 75   Recent Labs: 09/30/2015: BUN 16; Creat 0.87; Potassium 4.5; Sodium 142    Lipid Panel  No results found for: CHOL, TRIG, HDL, CHOLHDL, VLDL, LDLCALC, LDLDIRECT   Wt Readings from Last 3 Encounters:  11/16/15 187 lb 3.2 oz (84.9 kg)  09/30/15 185 lb 9.6 oz (84.2 kg)  08/25/15 185 lb 12.8 oz (84.3 kg)      Other studies Reviewed: Additional studies/ records that were reviewed today include: TTE 05/16/15  Review of the above records today demonstrates:  - Left ventricle: The cavity size was normal. Systolic function was  normal. The estimated ejection fraction was in the range of 50%  to 55%. Wall motion was normal; there were no regional wall  motion abnormalities. - Aortic valve: Trileaflet; normal thickness, mildly calcified  leaflets.   ASSESSMENT AND PLAN:  1.  SVT: Negative EP study 07/2015. She is continuing to have episodes of palpitations that have gotten worse over the last month. I offered her either repeat attempt at ablation versus adjusting medications. She would like to discuss this with her husband.  2. Lower extremity edema: Edema has significantly improved. No changes at this time.  Current medicines are reviewed at length with the patient today.   The patient does not have concerns regarding her medicines.  The following changes were made today: none  Proceduredered today include:  No orders of the defined types were placed in this encounter.    Disposition:   FU with Susette Seminara 6 months  Signed, Amando Chaput Jorja Loa, MD  08/30/2016 2:39 PM     [facility] [address] [place] [address] is [phone] (fax)

## 2016-10-20 ENCOUNTER — Other Ambulatory Visit: Payer: Self-pay | Admitting: Cardiology

## 2016-12-02 ENCOUNTER — Other Ambulatory Visit: Payer: Self-pay | Admitting: *Deleted

## 2016-12-02 MED ORDER — DILTIAZEM HCL ER COATED BEADS 180 MG PO CP24: 180.0000 mg | ORAL_CAPSULE | Freq: Every day | ORAL | 2 refills | Status: DC

## 2016-12-02 NOTE — Telephone Encounter (Signed)
Patient left a msg on the refill vm stating that per [facility] she did not have any refills remaining on the diltiazem. Per epic this was refilled last month via [facility]. [facility] and they stated that they did receive the rx but the patients insurance is requiring ninety days. I will update rx and re-send. Pharmacist aware.

## 2017-01-12 ENCOUNTER — Telehealth: Payer: Self-pay | Admitting: Cardiology

## 2017-01-12 MED ORDER — DILTIAZEM HCL 60 MG PO TABS: 60.0000 mg | ORAL_TABLET | Freq: Every day | ORAL | 3 refills | Status: DC | PRN

## 2017-01-12 NOTE — Telephone Encounter (Signed)
Pt calls in exhausted and tired of elevated HRs. Reports the week before last her HR averaged 140s for most of the week.   "I knew I was ok, but it lasted for 7 1/2 hours long one day and I was exhausted".  "It would go down for 10 minutes and shoot back up", during this week long episode.  When asked if she took PRN Diltiazem she replied no. She states, "I can work out and be at 147 and be fine.  But at rest when it happens I can feel it moving from my back to the front and I just feel worn out" Tearful, she states "last 2 hours have been rough for me" When pt went to take her PRN medication today, she noticed it was expired by more than 6 months.  Informed pt that I would send a new Rx for the medication. Advised to continue to monitor the issue, take the PRN Diltiazem when she has increased HR.  Also advised that if the one pill doesn't work, and she is still experiencing elevated HRs 4-6 hours later, to call the office for updated order to take PRN more frequently. Made appt for 12/24 to discuss further w/ Dr. Elberta Fortisamnitz.  (she is considering repeat ablation b/c "she just can't live like this anymore") She is very appreciative of my help, listening, and helping come up with a solution to this "irriation".

## 2017-01-12 NOTE — Telephone Encounter (Signed)
Patient c/o Palpitations:  High priority if patient c/o lightheadedness, shortness of breath, or chest pain  1) How long have you had palpitations/irregular HR/ Afib? Are you having the symptoms now?   yes  2) Are you currently experiencing lightheadedness, SOB or CP? no  3) Do you have a history of afib (atrial fibrillation) or irregular heart rhythm?  yes  Have you checked your BP or HR? (document readings if available):  No HR 127 4) Are you experiencing any other symptoms? Dizziness

## 2017-01-30 ENCOUNTER — Other Ambulatory Visit: Payer: Self-pay | Admitting: Cardiology

## 2017-01-30 ENCOUNTER — Ambulatory Visit: Payer: BLUE CROSS/BLUE SHIELD | Admitting: Cardiology

## 2017-01-30 ENCOUNTER — Encounter: Payer: Self-pay | Admitting: *Deleted

## 2017-01-30 ENCOUNTER — Encounter (INDEPENDENT_AMBULATORY_CARE_PROVIDER_SITE_OTHER): Payer: Self-pay

## 2017-01-30 ENCOUNTER — Encounter: Payer: Self-pay | Admitting: Cardiology

## 2017-01-30 VITALS — BP 110/68 | HR 71 | Ht 65.0 in | Wt 161.0 lb

## 2017-01-30 DIAGNOSIS — I471 Supraventricular tachycardia: Secondary | ICD-10-CM | POA: Diagnosis not present

## 2017-01-30 MED ORDER — DILTIAZEM HCL ER COATED BEADS 240 MG PO CP24: 240.0000 mg | ORAL_CAPSULE | Freq: Every day | ORAL | 3 refills | Status: DC

## 2017-01-30 NOTE — Patient Instructions (Addendum)
Medication Instructions:  Your physician has recommended you make the following change in your medication:  1. INCREASE Diltiazem to 240 mg daily  * If you need a refill on your cardiac medications before your next appointment, please call your pharmacy. *  Labwork: None ordered  Testing/Procedures: Your physician has recommended that you have a repeat SVT ablation. Catheter ablation is a medical procedure used to treat some cardiac arrhythmias (irregular heartbeats). During catheter ablation, a long, thin, flexible tube is put into a blood vessel in your groin (upper thigh), or neck. This tube is called an ablation catheter. It is then guided to your heart through the blood vessel. Radio frequency waves destroy small areas of heart tissue where abnormal heartbeats may cause an arrhythmia to start. Please see the instruction sheet given to you today.  Follow-Up: Your physician recommends that you schedule a follow-up appointment in: 4 weeks, after your procedure on 03/02/16, with Dr. Elberta Tucker.  Thank you for choosing [facility]!!   Dory HornSherri Shloime Keilman, RN [phone]  Any Other Special Instructions Will Be Listed Below (If Applicable).   Cardiac Ablation Cardiac ablation is a procedure to disable (ablate) a small amount of heart tissue in very specific places. The heart has many electrical connections. Sometimes these connections are abnormal and can cause the heart to beat very fast or irregularly. Ablating some of the problem areas can improve the heart rhythm or return it to normal. Ablation may be done for people who:  Have Wolff-Parkinson-White syndrome.  Have fast heart rhythms (tachycardia).  Have taken medicines for an abnormal heart rhythm (arrhythmia) that were not effective or caused side effects.  Have a high-risk heartbeat that may be life-threatening.  During the procedure, a small incision is made in the neck or the groin, and a long, thin, flexible tube (catheter) is  inserted into the incision and moved to the heart. Small devices (electrodes) on the tip of the catheter will send out electrical currents. A type of X-ray (fluoroscopy) will be used to help guide the catheter and to provide images of the heart. Tell a health care provider about:  Any allergies you have.  All medicines you are taking, including vitamins, herbs, eye drops, creams, and over-the-counter medicines.  Any problems you or family members have had with anesthetic medicines.  Any blood disorders you have.  Any surgeries you have had.  Any medical conditions you have, such as kidney failure.  Whether you are pregnant or may be pregnant. What are the risks? Generally, this is a safe procedure. However, problems may occur, including:  Infection.  Bruising and bleeding at the catheter insertion site.  Bleeding into the chest, especially into the sac that surrounds the heart. This is a serious complication.  Stroke or blood clots.  Damage to other structures or organs.  Allergic reaction to medicines or dyes.  Need for a permanent pacemaker if the normal electrical system is damaged. A pacemaker is a small computer that sends electrical signals to the heart and helps your heart beat normally.  The procedure not being fully effective. This may not be recognized until months later. Repeat ablation procedures are sometimes required.  What happens before the procedure?  Follow instructions from your health care provider about eating or drinking restrictions.  Ask your health care provider about: ? Changing or stopping your regular medicines. This is especially important if you are taking diabetes medicines or blood thinners. ? Taking medicines such as aspirin and ibuprofen. These medicines can  thin your blood. Do not take these medicines before your procedure if your health care provider instructs you not to.  Plan to have someone take you home from the hospital or  clinic.  If you will be going home right after the procedure, plan to have someone with you for 24 hours. What happens during the procedure?  To lower your risk of infection: ? Your health care team will wash or sanitize their hands. ? Your skin will be washed with soap. ? Hair may be removed from the incision area.  An IV tube will be inserted into one of your veins.  You will be given a medicine to help you relax (sedative).  The skin on your neck or groin will be numbed.  An incision will be made in your neck or your groin.  A needle will be inserted through the incision and into a large vein in your neck or groin.  A catheter will be inserted into the needle and moved to your heart.  Dye may be injected through the catheter to help your surgeon see the area of the heart that needs treatment.  Electrical currents will be sent from the catheter to ablate heart tissue in desired areas. There are three types of energy that may be used to ablate heart tissue: ? Heat (radiofrequency energy). ? Laser energy. ? Extreme cold (cryoablation).  When the necessary tissue has been ablated, the catheter will be removed.  Pressure will be held on the catheter insertion area to prevent excessive bleeding.  A bandage (dressing) will be placed over the catheter insertion area. The procedure may vary among health care providers and hospitals. What happens after the procedure?  Your blood pressure, heart rate, breathing rate, and blood oxygen level will be monitored until the medicines you were given have worn off.  Your catheter insertion area will be monitored for bleeding. You will need to lie still for a few hours to ensure that you do not bleed from the catheter insertion area.  Do not drive for 24 hours or as long as directed by your health care provider. Summary  Cardiac ablation is a procedure to disable (ablate) a small amount of heart tissue in very specific places. Ablating some  of the problem areas can improve the heart rhythm or return it to normal.  During the procedure, electrical currents will be sent from the catheter to ablate heart tissue in desired areas. This information is not intended to replace advice given to you by your health care provider. Make sure you discuss any questions you have with your health care provider. Document Released: 06/12/2008 Document Revised: 12/14/2015 Document Reviewed: 12/14/2015 Elsevier Interactive Patient Education  [employer]2018 [employer]

## 2017-01-30 NOTE — Progress Notes (Signed)
Electrophysiology Office Note   Date:  01/30/2017   ID:  Grace Tucker, DOB 1960-08-18, MRN [id number]  PCP:  Patient, No Pcp Per  Cardiologist:  Mayford Knifeurner Primary Electrophysiologist:  Paulene Tayag Jorja LoaMartin Tonea Leiphart, MD    Chief Complaint  Patient presents with  . Palpitations     History of Present Illness: Grace Tucker is a 56 y.o. female who presents today for electrophysiology evaluation.   She presented to the hospital on 05/15/15 after an episode of syncope.  She was under quite a bit of stress around the time of her syncope.  Prior to the syncope, she began feeling a funny feeling in her chest with lightheadedness and weakness, then collapsed.  She had 2-3 similar episodes over the next 30 minutes.  In the ER, she had several runs of SVT in the 180s.  She was treated with metoprolol 5 mg x2.  Had negative EP study 07/22/15. Over the last month, she has had more severe palpitations.   Today, denies symptoms of chest pain, shortness of breath, orthopnea, PND, lower extremity edema, claudication, dizziness, presyncope, syncope, bleeding, or neurologic sequela. The patient is tolerating medications without difficulties.  She is continued to have palpitations.  She has had no further episodes of syncope.  She has had to take diltiazem on and off over the last few weeks to help out with her palpitations.  Past Medical History:  Diagnosis Date  . Asthma   . Syncope 05/16/2015   In setting of SVT   Past Surgical History:  Procedure Laterality Date  . APPENDECTOMY    . CHOLECYSTECTOMY N/A 01/18/2013   Procedure: LAPAROSCOPIC CHOLECYSTECTOMY WITH INTRAOPERATIVE CHOLANGIOGRAM;  Surgeon: Velora Hecklerodd M Gerkin, MD;  [facility];  Service: General;  Laterality: N/A;  . ELECTROPHYSIOLOGIC STUDY N/A 07/22/2015   Procedure: SVT Ablation;  Surgeon: Akoni Parton Jorja LoaMartin Mahogany Torrance, MD;  Location: [facility] INVASIVE CV LAB;  Service: Cardiovascular;  Laterality: N/A;     Current Outpatient Medications    Medication Sig Dispense Refill  . diltiazem (CARDIZEM CD) 180 MG 24 hr capsule Take 1 capsule (180 mg total) by mouth daily. 90 capsule 2  . diltiazem (CARDIZEM) 60 MG tablet Take 1 tablet (60 mg total) by mouth daily as needed. For increased heart rate. 15 tablet 3  . ibuprofen (ADVIL,MOTRIN) 200 MG tablet Take 200 mg by mouth every 6 (six) hours as needed for moderate pain.    . ranitidine (ZANTAC) 150 MG capsule Take 150 mg by mouth daily as needed for heartburn.     No current facility-administered medications for this visit.     Allergies:   Flecainide and Ceclor [cefaclor]   Social History:  The patient  reports that  has never smoked. she has never used smokeless tobacco. She reports that she does not drink alcohol or use drugs.   Family History:  The patient's family history includes Colon cancer in her mother.    ROS:  Please see the history of present illness.   Otherwise, review of systems is positive for fatigue, palpitations.   All other systems are reviewed and negative.   PHYSICAL EXAM: VS:  BP 110/68   Pulse 71   Ht 5\' 5"  (1.651 m)   Wt 161 lb (73 kg)   SpO2 97%   BMI 26.79 kg/m  , BMI Body mass index is 26.79 kg/m. GEN: Well nourished, well developed, in no acute distress  HEENT: normal  Neck: no JVD, carotid bruits, or masses Cardiac: RRR; no murmurs,  rubs, or gallops,no edema  Respiratory:  clear to auscultation bilaterally, normal work of breathing GI: soft, nontender, nondistended, + BS MS: no deformity or atrophy  Skin: warm and dry Neuro:  Strength and sensation are intact Psych: euthymic mood, full affect  EKG:  EKG is ordered today. Personal review of the ekg ordered shows sinus rhythm, rate 62   Recent Labs: No results found for requested labs within last 8760 hours.    Lipid Panel  No results found for: CHOL, TRIG, HDL, CHOLHDL, VLDL, LDLCALC, LDLDIRECT   Wt Readings from Last 3 Encounters:  01/30/17 161 lb (73 kg)  08/30/16 169 lb 6 oz  (76.8 kg)  11/16/15 187 lb 3.2 oz (84.9 kg)      Other studies Reviewed: Additional studies/ records that were reviewed today include: TTE 05/16/15  Review of the above records today demonstrates:  - Left ventricle: The cavity size was normal. Systolic function was  normal. The estimated ejection fraction was in the range of 50%  to 55%. Wall motion was normal; there were no regional wall  motion abnormalities. - Aortic valve: Trileaflet; normal thickness, mildly calcified  leaflets.   ASSESSMENT AND PLAN:  1.  SVT: Negative EP study 07/2015.  Is continuing to do so to this fatigue.  She is currently on 180 mg of diltiazem.  We Fritz Cauthon increase that to 240 mg.  We Jaiven Graveline plan for ablation.  Risks and benefits include bleeding, tamponade, heart block, and stroke among others.  She understands these risks and is agreed to the procedure.  Current medicines are reviewed at length with the patient today.   The patient does not have concerns regarding her medicines.  The following changes were made today: none  Proceduredered today include:  Orders Placed This Encounter  Procedures  . EKG 12-Lead     Disposition:   FU with Maddalynn Barnard 3 months  Signed, Shyheem Whitham Jorja LoaMartin Zyairah Wacha, MD  01/30/2017 8:26 AM     [facility] [address] [place] [address] is [phone] (fax)

## 2017-02-23 ENCOUNTER — Telehealth: Payer: Self-pay | Admitting: Cardiology

## 2017-02-23 NOTE — Telephone Encounter (Signed)
New message  Patient says that she needs to know if she would be okay to fly to [place] 5 days after her ablation Please call

## 2017-02-23 NOTE — Telephone Encounter (Addendum)
Informed ok to travel per Dr. Elberta Fortisamnitz. Pt sounded sick on phone, when I inquired about it she tells me that she has the "crud". Advised pt that if she was sick on Tuesday to call me to discuss if safe to proceed w/ repeat RFA on Thursday.   Patient verbalized understanding and agreeable to plan.

## 2017-03-02 ENCOUNTER — Encounter (HOSPITAL_COMMUNITY)
Admission: RE | Disposition: A | Discharge status: Home / Self Care | Payer: Self-pay | Source: Ambulatory Visit | Attending: Cardiology

## 2017-03-02 ENCOUNTER — Ambulatory Visit (HOSPITAL_COMMUNITY)
Admission: RE | Admit: 2017-03-02 | Discharge: 2017-03-02 | Disposition: A | Discharge status: Home / Self Care | Payer: BLUE CROSS/BLUE SHIELD | Source: Ambulatory Visit | Attending: Cardiology | Admitting: Cardiology

## 2017-03-02 DIAGNOSIS — I471 Supraventricular tachycardia: Secondary | ICD-10-CM | POA: Diagnosis not present

## 2017-03-02 DIAGNOSIS — Z881 Allergy status to other antibiotic agents status: Secondary | ICD-10-CM | POA: Diagnosis not present

## 2017-03-02 HISTORY — PX: SVT ABLATION: EP1225

## 2017-03-02 LAB — CBC
HCT: 41.9 % (ref 36.0–46.0)
Hemoglobin: 13.7 g/dL (ref 12.0–15.0)
MCH: 30.6 pg (ref 26.0–34.0)
MCHC: 32.7 g/dL (ref 30.0–36.0)
MCV: 93.5 fL (ref 78.0–100.0)
PLATELETS: 274 10*3/uL (ref 150–400)
RBC: 4.48 MIL/uL (ref 3.87–5.11)
RDW: 12.5 % (ref 11.5–15.5)
WBC: 5.5 10*3/uL (ref 4.0–10.5)

## 2017-03-02 LAB — BASIC METABOLIC PANEL
Anion gap: 8 (ref 5–15)
BUN: 16 mg/dL (ref 6–20)
CALCIUM: 9 mg/dL (ref 8.9–10.3)
CO2: 25 mmol/L (ref 22–32)
CREATININE: 0.92 mg/dL (ref 0.44–1.00)
Chloride: 106 mmol/L (ref 101–111)
GFR calc Af Amer: >60 mL/min (ref >60)
Glucose, Bld: 89 mg/dL (ref 65–99)
Potassium: 4 mmol/L (ref 3.5–5.1)
SODIUM: 139 mmol/L (ref 135–145)

## 2017-03-02 SURGERY — SVT ABLATION

## 2017-03-02 MED ORDER — DILTIAZEM HCL ER COATED BEADS 300 MG PO CP24: 300.0000 mg | ORAL_CAPSULE | Freq: Every day | ORAL | 11 refills | Status: DC

## 2017-03-02 MED ORDER — SODIUM CHLORIDE 0.9% FLUSH: 3.0000 mL | INTRAVENOUS | Status: DC | PRN

## 2017-03-02 MED ORDER — ALBUTEROL SULFATE HFA 108 (90 BASE) MCG/ACT IN AERS: 2.0000 | INHALATION_SPRAY | Freq: Four times a day (QID) | RESPIRATORY_TRACT | Status: DC | PRN

## 2017-03-02 MED ORDER — SODIUM CHLORIDE 0.9 % IV SOLN: 250.0000 mL | INTRAVENOUS | Status: DC | PRN

## 2017-03-02 MED ORDER — HEPARIN (PORCINE) IN NACL 2-0.9 UNIT/ML-% IJ SOLN
INTRAMUSCULAR | Status: AC
  Filled 2017-03-02: qty 500

## 2017-03-02 MED ORDER — ACETAMINOPHEN 325 MG PO TABS
650.0000 mg | ORAL_TABLET | ORAL | Status: DC | PRN
  Filled 2017-03-02: qty 2

## 2017-03-02 MED ORDER — DILTIAZEM HCL ER COATED BEADS 300 MG PO CP24: 300.0000 mg | ORAL_CAPSULE | Freq: Every day | ORAL | Status: DC

## 2017-03-02 MED ORDER — ONDANSETRON HCL 4 MG/2ML IJ SOLN: 4.0000 mg | Freq: Four times a day (QID) | INTRAMUSCULAR | Status: DC | PRN

## 2017-03-02 MED ORDER — IBUPROFEN 200 MG PO TABS: 200.0000 mg | ORAL_TABLET | Freq: Four times a day (QID) | ORAL | Status: DC | PRN

## 2017-03-02 MED ORDER — ISOPROTERENOL HCL 0.2 MG/ML IJ SOLN
INTRAVENOUS | Status: DC | PRN
  Administered 2017-03-02: 2 ug/min via INTRAVENOUS

## 2017-03-02 MED ORDER — BUPIVACAINE HCL (PF) 0.25 % IJ SOLN
INTRAMUSCULAR | Status: AC
  Filled 2017-03-02: qty 60

## 2017-03-02 MED ORDER — BUPIVACAINE HCL (PF) 0.25 % IJ SOLN
INTRAMUSCULAR | Status: DC | PRN
  Administered 2017-03-02: 60 mL

## 2017-03-02 MED ORDER — FENTANYL CITRATE (PF) 100 MCG/2ML IJ SOLN
INTRAMUSCULAR | Status: DC | PRN
  Administered 2017-03-02 (×2): 25 ug via INTRAVENOUS

## 2017-03-02 MED ORDER — ISOPROTERENOL HCL 0.2 MG/ML IJ SOLN
INTRAMUSCULAR | Status: AC
  Filled 2017-03-02: qty 5

## 2017-03-02 MED ORDER — MIDAZOLAM HCL 5 MG/5ML IJ SOLN
INTRAMUSCULAR | Status: AC
  Filled 2017-03-02: qty 5

## 2017-03-02 MED ORDER — FAMOTIDINE 10 MG PO TABS: 10.0000 mg | ORAL_TABLET | Freq: Every day | ORAL | Status: DC

## 2017-03-02 MED ORDER — HEPARIN (PORCINE) IN NACL 2-0.9 UNIT/ML-% IJ SOLN
INTRAMUSCULAR | Status: AC | PRN
  Administered 2017-03-02: 500 mL

## 2017-03-02 MED ORDER — FENTANYL CITRATE (PF) 100 MCG/2ML IJ SOLN
INTRAMUSCULAR | Status: AC
  Filled 2017-03-02: qty 2

## 2017-03-02 MED ORDER — SODIUM CHLORIDE 0.9% FLUSH: 3.0000 mL | Freq: Two times a day (BID) | INTRAVENOUS | Status: DC

## 2017-03-02 MED ORDER — MIDAZOLAM HCL 5 MG/5ML IJ SOLN
INTRAMUSCULAR | Status: DC | PRN
  Administered 2017-03-02 (×3): 1 mg via INTRAVENOUS

## 2017-03-02 SURGICAL SUPPLY — 10 items
BAG SNAP BAND KOVER 36X36 (MISCELLANEOUS) ×3 IMPLANT
CATH JOSEPHSON QUAD-ALLRED 6FR (CATHETERS) ×6 IMPLANT
CATH WEBSTER BI DIR CS D-F CRV (CATHETERS) ×3 IMPLANT
PACK EP LATEX FREE (CUSTOM PROCEDURE TRAY) ×2
PACK EP LF (CUSTOM PROCEDURE TRAY) ×1 IMPLANT
PAD DEFIB LIFELINK (PAD) ×3 IMPLANT
PATCH CARTO3 (PAD) ×3 IMPLANT
SHEATH PINNACLE 6F 10CM (SHEATH) ×6 IMPLANT
SHEATH PINNACLE 7F 10CM (SHEATH) ×3 IMPLANT
SHEATH PINNACLE 8F 10CM (SHEATH) ×3 IMPLANT

## 2017-03-02 NOTE — Progress Notes (Signed)
The patient is seen post procedure, she is without c/o, no CP, palpitations or SOB, no site discomfort. SR on telemetry VSS Activity restrictions and site care were discussed with the patient/husband at bedside B/l groin sites are soft, no bleeding, no hematoma F/u has been arranged. Anticipate home after bedrest is completed if patient and procedure sites remain stable after ambulation.  Francis Dowseenee Ursuy, PA-C  Loman BrooklynWill Kiernan Farkas, MD

## 2017-03-02 NOTE — Discharge Instructions (Signed)
Post procedure instructions No driving for 4 days. No lifting over 5 lbs for 1 week. No vigorous or  sexual activity for 1 week. You may return to work in one week. Keep procedure site clean & dry. If you notice increased pain, swelling, bleeding or pus, call/return!  You may shower, but no soaking baths/hot tubs/pools for 1 week.   Venogram, Care After This sheet gives you information about how to care for yourself after your procedure. Your health care provider may also give you more specific instructions. If you have problems or questions, contact your health care provider. What can I expect after the procedure? After the procedure, it is common to have bruising and tenderness at the catheter insertion area. Follow these instructions at home: Insertion site care  Follow instructions from your health care provider about how to take care of your insertion site. Make sure you: ? Wash your hands with soap and water before you change your bandage (dressing). If soap and water are not available, use hand sanitizer. ? Change your dressing as told by your health care provider. ? Leave stitches (sutures), skin glue, or adhesive strips in place. These skin closures may need to stay in place for 2 weeks or longer. If adhesive strip edges start to loosen and curl up, you may trim the loose edges. Do not remove adhesive strips completely unless your health care provider tells you to do that.  Do not take baths, swim, or use a hot tub until your health care provider approves.  You may shower 24-48 hours after the procedure or as told by your health care provider. ? Gently wash the site with plain soap and water. ? Pat the area dry with a clean towel. ? Do not rub the site. This may cause bleeding.  Do not apply powder or lotion to the site. Keep the site clean and dry.  Check your insertion site every day for signs of infection. Check for: ? Redness, swelling, or pain. ? Fluid or  blood. ? Warmth. ? Pus or a bad smell. Activity  Rest as told by your health care provider, usually for 1-2 days.  Do not lift anything that is heavier than 10 lbs. (4.5 kg) or as told by your health care provider.  Do not drive for 24 hours if you were given a medicine to help you relax (sedative).  Do not drive or use heavy machinery while taking prescription pain medicine. General instructions  Return to your normal activities as told by your health care provider, usually in about a week. Ask your health care provider what activities are safe for you.  If the catheter site starts bleeding, lie flat and put pressure on the site. If the bleeding does not stop, get help right away. This is a medical emergency.  Drink enough fluid to keep your urine clear or pale yellow. This helps flush the contrast dye from your body.  Take over-the-counter and prescription medicines only as told by your health care provider.  Keep all follow-up visits as told by your health care provider. This is important. Contact a health care provider if:  You have a fever or chills.  You have redness, swelling, or pain around your insertion site.  You have fluid or blood coming from your insertion site.  The insertion site feels warm to the touch.  You have pus or a bad smell coming from your insertion site.  You have bruising around the insertion site.  You notice  blood collecting in the tissue around the catheter site (hematoma). The hematoma may be painful to the touch. Get help right away if:  You have severe pain at the catheter insertion area.  The catheter insertion area swells very fast.  The catheter insertion area is bleeding, and the bleeding does not stop when you hold steady pressure on the area.  The area near or just beyond the catheter insertion site becomes pale, cool, tingly, or numb. These symptoms may represent a serious problem that is an emergency. Do not wait to see if the  symptoms will go away. Get medical help right away. Call your local emergency services (911 in the [place] Do not drive yourself to the hospital. Summary  After the procedure, it is common to have bruising and tenderness at the catheter insertion area.  After the procedure, it is important to rest and drink plenty of fluids.  Do not take baths, swim, or use a hot tub until your health care provider says it is okay to do so. You may shower 24-48 hours after the procedure or as told by your health care provider.  If the catheter site starts bleeding, lie flat and put pressure on the site. If the bleeding does not stop, get help right away. This is a medical emergency. This information is not intended to replace advice given to you by your health care provider. Make sure you discuss any questions you have with your health care provider. Document Released: 08/12/2004 Document Revised: 12/30/2015 Document Reviewed: 12/30/2015 Elsevier Interactive Patient Education  [employer]2018 [employer]

## 2017-03-02 NOTE — Progress Notes (Signed)
Site area: 2 rt fv sheaths pulled and pressure held by [employer]. 2 lt fv sheaths pulled and pressure held by Cherie Ouchachael James Site Prior to Removal:  Level 0 Pressure Applied For: 20 minutes each side Manual:   yes Patient Status During Pull:  stable Post Pull Site:  Level  0 bilaterally Post Pull Instructions Given:  yes Post Pull Pulses Present: palpable Dressing Applied:  Gauze and tegaderm Bedrest begins @ 1340 Comments: IV saline locked

## 2017-03-02 NOTE — H&P (Signed)
Electrophysiology Office Note   Date:  03/02/2017   ID:  Grace Tucker, DOB [birth year], MRN [id number]  PCP:  Patient, No Pcp Per  Cardiologist:  Mayford Knifeurner Primary Electrophysiologist:  Aleyza Salmi Jorja LoaMartin Zyria Fiscus, MD       History of Present Illness: Grace Tucker is a 57 y.o. female who presents today for electrophysiology evaluation.   She presented to the hospital on 05/15/15 after an episode of syncope.  She was under quite a bit of stress around the time of her syncope.  Prior to the syncope, she began feeling a funny feeling in her chest with lightheadedness and weakness, then collapsed.  She had 2-3 similar episodes over the next 30 minutes.  In the ER, she had several runs of SVT in the 180s.  She was treated with metoprolol 5 mg x2.  Had negative EP study 07/22/15. Over the last month, she has had more severe palpitations.   Today, denies symptoms of palpitations, chest pain, shortness of breath, orthopnea, PND, lower extremity edema, claudication, dizziness, presyncope, syncope, bleeding, or neurologic sequela. The patient is tolerating medications without difficulties. Plan for ablation for SVT today.   Past Medical History:  Diagnosis Date  . Asthma   . Syncope 05/16/2015   In setting of SVT   Past Surgical History:  Procedure Laterality Date  . APPENDECTOMY    . CHOLECYSTECTOMY N/A 01/18/2013   Procedure: LAPAROSCOPIC CHOLECYSTECTOMY WITH INTRAOPERATIVE CHOLANGIOGRAM;  Surgeon: Velora Hecklerodd M Gerkin, MD;  [facility];  Service: General;  Laterality: N/A;  . ELECTROPHYSIOLOGIC STUDY N/A 07/22/2015   Procedure: SVT Ablation;  Surgeon: Airam Runions Jorja LoaMartin Blenda Wisecup, MD;  Location: [facility];  Service: Cardiovascular;  Laterality: N/A;     No current facility-administered medications for this encounter.     Allergies:   Flecainide and Ceclor [cefaclor]   Social History:  The patient  reports that  has never smoked. she has never used smokeless tobacco. She  reports that she does not drink alcohol or use drugs.   Family History:  The patient's family history includes Colon cancer in her mother.   ROS:  Please see the history of present illness.   Otherwise, review of systems is positive for palpitations.   All other systems are reviewed and negative.   PHYSICAL EXAM: VS:  BP 112/78   Pulse 68   Temp 98.3 F (36.8 C) (Oral)   Resp 18   Ht 5\' 5"  (1.651 m)   Wt 158 lb (71.7 kg)   SpO2 100%   BMI 26.29 kg/m  , BMI Body mass index is 26.29 kg/m. GEN: Well nourished, well developed, in no acute distress  HEENT: normal  Neck: no JVD, carotid bruits, or masses Cardiac: RRR; no murmurs, rubs, or gallops,no edema  Respiratory:  clear to auscultation bilaterally, normal work of breathing GI: soft, nontender, nondistended, + BS MS: no deformity or atrophy  Skin: warm and dry Neuro:  Strength and sensation are intact Psych: euthymic mood, full affect   Recent Labs: No results found for requested labs within last 8760 hours.    Lipid Panel  No results found for: CHOL, TRIG, HDL, CHOLHDL, VLDL, LDLCALC, LDLDIRECT   Wt Readings from Last 3 Encounters:  03/02/17 158 lb (71.7 kg)  01/30/17 161 lb (73 kg)  08/30/16 169 lb 6 oz (76.8 kg)      Other studies Reviewed: Additional studies/ records that were reviewed today include: TTE 05/16/15  Review of the above records today demonstrates:  -  Left ventricle: The cavity size was normal. Systolic function was  normal. The estimated ejection fraction was in the range of 50%  to 55%. Wall motion was normal; there were no regional wall  motion abnormalities. - Aortic valve: Trileaflet; normal thickness, mildly calcified  leaflets.   ASSESSMENT AND PLAN:  1.  SVT: Prior negative EP study but with continued symptoms. Plan for ablation. Risks and benefits discussed. Risks include but not limited to bleeding, tamponade, heart block, stroke, MI, death. She understands the risks and has agreed  to the procedure.   Signed, Victorious Kundinger Jorja Loa, MD  03/02/2017 10:29 AM

## 2017-03-03 ENCOUNTER — Encounter (HOSPITAL_COMMUNITY): Payer: Self-pay | Admitting: Cardiology

## 2017-03-10 ENCOUNTER — Telehealth: Payer: Self-pay | Admitting: Cardiology

## 2017-03-10 DIAGNOSIS — R002 Palpitations: Secondary | ICD-10-CM

## 2017-03-10 DIAGNOSIS — Z8679 Personal history of other diseases of the circulatory system: Secondary | ICD-10-CM

## 2017-03-10 DIAGNOSIS — Z9889 Other specified postprocedural states: Secondary | ICD-10-CM

## 2017-03-10 NOTE — Telephone Encounter (Signed)
Advised pt Dr. Elberta Fortisamnitz agrees with needing monitoring.  She understands office will call her to arrange holter monitor. She inquired if safe to try taking some Melatonin to help her sleep.  Advised pt there is no interactions with that and her medications. She is appreciative of the help and agreeable to plan.

## 2017-03-10 NOTE — Telephone Encounter (Signed)
S/p SVT Ablation 1/24. Pt reports feeling "shaky and exhausted" since procedure.  She has not be able to sleep more that 5590m-1hr at a time.  "it will wake me up, start racing and go down pretty quickly".  States HRs still racing, BPs normal and only a little dizzy.  Denies CP, SOB, syncope. "Racing" is not lasting 6 hours like it was a couple weeks ago.  She does report feeling is different than before ablation.  "I think of it as a flutter compared to before the ablation".She understands that this could be breakthrough episodes post RFA.   Being that she is describing a "differend feeling" [facility].  (Dr. Elberta Fortisamnitz do you want a 48 hour holter to determine rhythm/rate/frequency/episode length??.  I did already warn pt that we may order one and she would be agreeable)

## 2017-03-10 NOTE — Telephone Encounter (Signed)
New message    Patient calling to report she doesn't "feel right".  No chest pain Not SOB. Patient states she feels jittery and can not sleep since ablation. Please call

## 2017-03-15 ENCOUNTER — Ambulatory Visit (INDEPENDENT_AMBULATORY_CARE_PROVIDER_SITE_OTHER): Payer: BLUE CROSS/BLUE SHIELD

## 2017-03-15 DIAGNOSIS — Z9889 Other specified postprocedural states: Secondary | ICD-10-CM | POA: Diagnosis not present

## 2017-03-15 DIAGNOSIS — Z8679 Personal history of other diseases of the circulatory system: Secondary | ICD-10-CM | POA: Diagnosis not present

## 2017-03-15 DIAGNOSIS — R002 Palpitations: Secondary | ICD-10-CM

## 2017-03-29 ENCOUNTER — Encounter: Payer: Self-pay | Admitting: Cardiology

## 2017-03-29 ENCOUNTER — Ambulatory Visit: Payer: BLUE CROSS/BLUE SHIELD | Admitting: Cardiology

## 2017-03-29 ENCOUNTER — Ambulatory Visit (HOSPITAL_COMMUNITY)
Admission: RE | Admit: 2017-03-29 | Discharge: 2017-03-29 | Disposition: A | Discharge status: Home / Self Care | Payer: BLUE CROSS/BLUE SHIELD | Source: Ambulatory Visit | Attending: Cardiology | Admitting: Cardiology

## 2017-03-29 VITALS — BP 110/82 | HR 64 | Ht 65.0 in | Wt 157.0 lb

## 2017-03-29 DIAGNOSIS — R600 Localized edema: Secondary | ICD-10-CM

## 2017-03-29 DIAGNOSIS — M7989 Other specified soft tissue disorders: Secondary | ICD-10-CM | POA: Diagnosis not present

## 2017-03-29 DIAGNOSIS — I471 Supraventricular tachycardia: Secondary | ICD-10-CM

## 2017-03-29 MED ORDER — PROPAFENONE HCL ER 225 MG PO CP12: 225.0000 mg | ORAL_CAPSULE | Freq: Two times a day (BID) | ORAL | 3 refills | Status: DC

## 2017-03-29 NOTE — Progress Notes (Signed)
Electrophysiology Office Note   Date:  03/29/2017   ID:  Grace Tucker, Grace Tucker 1960-07-25, MRN [id number]  PCP:  Regan Lemming, MD  Cardiologist:  Mayford Knife Primary Electrophysiologist:  Latrell Potempa Jorja Loa, MD    Chief Complaint  Patient presents with  . Follow-up    SVT/Post ablation     History of Present Illness: Grace Tucker is a 57 y.o. female who presents today for electrophysiology evaluation.   She presented to the hospital on 05/15/15 after an episode of syncope.  She was under quite a bit of stress around the time of her syncope.  Prior to the syncope, she began feeling a funny feeling in her chest with lightheadedness and weakness, then collapsed.  She had 2-3 similar episodes over the next 30 minutes.  In the ER, she had several runs of SVT in the 180s.  She was treated with metoprolol 5 mg x2.  Had negative EP study 07/22/15.  He had continued and symptoms of palpitations and thus had a repeat EP study on 03/02/17 that showed no evidence of arrhythmia.  Today, denies symptoms of palpitations, chest pain, shortness of breath, orthopnea, PND, lower extremity edema, claudication, dizziness, presyncope, syncope, bleeding, or neurologic sequela. The patient is tolerating medications without difficulties.  She is continued to have palpitations since her EP study.  These can occur when doing just about any activity.  Her heart rate gets into the 115-120 range when this happens.  Palpitation usually last for 3 minutes, but yesterday lasted up to 45 minutes.  She is also been having leg swelling for the last few months.  Her right leg has been more swollen than her left without reason.  Her pants are fitting differently on the right side, more tightly.  Past Medical History:  Diagnosis Date  . Asthma   . Syncope 05/16/2015   In setting of SVT   Past Surgical History:  Procedure Laterality Date  . APPENDECTOMY    . CHOLECYSTECTOMY N/A 01/18/2013   Procedure: LAPAROSCOPIC CHOLECYSTECTOMY WITH INTRAOPERATIVE CHOLANGIOGRAM;  Surgeon: Velora Heckler, MD;  [facility];  Service: General;  Laterality: N/A;  . ELECTROPHYSIOLOGIC STUDY N/A 07/22/2015   Procedure: SVT Ablation;  Surgeon: Chantry Headen Jorja Loa, MD;  Location: [facility];  Service: Cardiovascular;  Laterality: N/A;  . SVT ABLATION N/A 03/02/2017   Procedure: SVT ABLATION;  Surgeon: Regan Lemming, MD;  Location: [facility];  Service: Cardiovascular;  Laterality: N/A;     Current Outpatient Medications  Medication Sig Dispense Refill  . albuterol (PROVENTIL HFA;VENTOLIN HFA) 108 (90 Base) MCG/ACT inhaler Inhale 2 puffs into the lungs every 6 (six) hours as needed for wheezing or shortness of breath.    . diltiazem (CARDIZEM CD) 300 MG 24 hr capsule Take 1 capsule (300 mg total) by mouth daily. 30 capsule 11  . diltiazem (CARDIZEM) 60 MG tablet Take 1 tablet (60 mg total) by mouth daily as needed. For increased heart rate. (Patient taking differently: Take 60 mg by mouth daily as needed (for increased heart rate). ) 15 tablet 3  . ibuprofen (ADVIL,MOTRIN) 200 MG tablet Take 200 mg by mouth every 6 (six) hours as needed for moderate pain.    . ranitidine (ZANTAC) 150 MG capsule Take 150 mg by mouth daily as needed for heartburn.    . propafenone (RYTHMOL SR) 225 MG 12 hr capsule Take 1 capsule (225 mg total) by mouth 2 (two) times daily. 60 capsule 3  No current facility-administered medications for this visit.     Allergies:   Flecainide and Ceclor [cefaclor]   Social History:  The patient  reports that  has never smoked. she has never used smokeless tobacco. She reports that she does not drink alcohol or use drugs.   Family History:  The patient's family history includes Colon cancer in her mother.    ROS:  Please see the history of present illness.   Otherwise, review of systems is positive for leg pain, palpitations, dizziness.   All other systems are  reviewed and negative.   PHYSICAL EXAM: VS:  BP 110/82   Pulse 64   Ht 5\' 5"  (1.651 m)   Wt 157 lb (71.2 kg)   BMI 26.13 kg/m  , BMI Body mass index is 26.13 kg/m. GEN: Well nourished, well developed, in no acute distress  HEENT: normal  Neck: no JVD, carotid bruits, or masses Cardiac: RRR; no murmurs, rubs, or gallops,no edema  Respiratory:  clear to auscultation bilaterally, normal work of breathing GI: soft, nontender, nondistended, + BS MS: no deformity or atrophy  Skin: warm and dry Neuro:  Strength and sensation are intact Psych: euthymic mood, full affect  EKG:  EKG is ordered today. Personal review of the ekg ordered shows SR, rate 64   Recent Labs: 03/02/2017: BUN 16; Creatinine, Ser 0.92; Hemoglobin 13.7; Platelets 274; Potassium 4.0; Sodium 139    Lipid Panel  No results found for: CHOL, TRIG, HDL, CHOLHDL, VLDL, LDLCALC, LDLDIRECT   Wt Readings from Last 3 Encounters:  03/29/17 157 lb (71.2 kg)  03/02/17 158 lb (71.7 kg)  01/30/17 161 lb (73 kg)      Other studies Reviewed: Additional studies/ records that were reviewed today include: TTE 05/16/15  Review of the above records today demonstrates:  - Left ventricle: The cavity size was normal. Systolic function was  normal. The estimated ejection fraction was in the range of 50%  to 55%. Wall motion was normal; there were no regional wall  motion abnormalities. - Aortic valve: Trileaflet; normal thickness, mildly calcified  leaflets.  Holter 03/15/16 - personally reviewed Minimum HR: 51 BPM at 3:51:51 AM Maximum HR: 124 BPM at 3:10:58 PM(2) Average HR: 72 BPM 0% PVCs, <1% APCs Sinus rhythm Occasional SVT with rates 115 BPM  ASSESSMENT AND PLAN:  1.  SVT: Negative EP study 07/2015.  Had continued palpitations with a second EP study that showed no evidence of SVT on 03/02/17.  Is unfortunately continuing to have episodes of palpitations.  Her monitor showed a narrow complex tachycardia with a rate of  115 beats a minute that was associated with her symptoms.  Due to that, we Britainy Kozub start her on propafenone 225 mg every 12 hours.  We Sharna Gabrys plan to see her back in 3 months.  2.  Right leg swelling: She is active and thus DVT would be less likely, though with her symptoms of swelling over the last few months, I feel that this is necessary to look into.  Zachory Mangual order a lower extremity Doppler.  Current medicines are reviewed at length with the patient today.   The patient does not have concerns regarding her medicines.  The following changes were made today: Propafenone  Proceduredered today include:  Orders Placed This Encounter  Procedures  . EKG 12-Lead     Disposition:   FU with Saleema Weppler 3 months  Signed, Tarahji Ramthun Jorja LoaMartin Safia Panzer, MD  03/29/2017 10:13 AM     [facility] [id number] [place] [address] is [phone] (fax)

## 2017-03-29 NOTE — Patient Instructions (Addendum)
Medication Instructions:  Your physician has recommended you make the following change in your medication:  1. START Propafenone 225 mg twice daily  * If you need a refill on your cardiac medications before your next appointment, please call your pharmacy. *  Labwork: None ordered  Testing/Procedures: Your physician has requested that you have a lower extremity venous duplex to evaluate your right lower extremity swelling.  Follow-Up: Your physician recommends that you schedule a follow-up appointment in: 3 months with Dr. Elberta Fortisamnitz.  Thank you for choosing [facility]!!   Dory HornSherri Walsie Smeltz, RN [phone]  Any Other Special Instructions Will Be Listed Below (If Applicable).

## 2017-04-05 ENCOUNTER — Telehealth: Payer: Self-pay | Admitting: *Deleted

## 2017-04-05 NOTE — Telephone Encounter (Signed)
Lm to discuss Propafenone.   (pt has [facility] trying to find [facility] to [facility] and has been unable. Found [facility] who could order/fill if needed.  Left message to see if pt insurance would allow her to go to that pharmacy to fill this particular Rx)

## 2017-04-07 NOTE — Telephone Encounter (Signed)
Pt was able to get the [facility] through [facility]. She started it today.

## 2017-04-14 ENCOUNTER — Telehealth: Payer: Self-pay | Admitting: Cardiology

## 2017-04-14 NOTE — Telephone Encounter (Signed)
Pt c/o medication issue:  1. Name of Medication: Propafenone  2. How are you currently taking this medication (dosage and times per day)? 225 mg // 2x a day  3. Are you having a reaction (difficulty breathing--STAT)? no  4. What is your medication issue? Hands feet and ankles  swollen

## 2017-04-14 NOTE — Telephone Encounter (Signed)
Pt stopped taking stating she was miserable. Advised that recommendation from Dr. Elberta Fortisamnitz is to start Sotalol 40 mg w/ EKG f/u.  Pt currently sick right now (sound terrible on the phone) and would like to discuss next week.  She will research the medication. Plan is to call her next Tues/Wed Patient verbalized understanding and agreeable to plan.

## 2017-04-20 NOTE — Telephone Encounter (Signed)
Pt still getting over "the crud".  She does not want to think about starting anything until she is better. We agreed to follow up next week.

## 2017-06-07 ENCOUNTER — Encounter: Payer: Self-pay | Admitting: Cardiology

## 2017-06-08 NOTE — Telephone Encounter (Signed)
lmtcb

## 2017-06-28 ENCOUNTER — Ambulatory Visit: Payer: BLUE CROSS/BLUE SHIELD | Admitting: Cardiology

## 2017-06-28 ENCOUNTER — Encounter (INDEPENDENT_AMBULATORY_CARE_PROVIDER_SITE_OTHER): Payer: Self-pay

## 2017-06-28 ENCOUNTER — Encounter: Payer: Self-pay | Admitting: Cardiology

## 2017-06-28 VITALS — BP 110/76 | HR 65 | Ht 65.0 in | Wt 154.8 lb

## 2017-06-28 DIAGNOSIS — I471 Supraventricular tachycardia: Secondary | ICD-10-CM | POA: Diagnosis not present

## 2017-06-28 MED ORDER — DILTIAZEM HCL ER COATED BEADS 360 MG PO CP24: 360.0000 mg | ORAL_CAPSULE | Freq: Every day | ORAL | 1 refills | Status: AC

## 2017-06-28 NOTE — Progress Notes (Signed)
Electrophysiology Office Note   Date:  06/28/2017   ID:  Grace Tucker, DOB 1960/08/15, MRN [id number]  PCP:  Patient, No Pcp Per  Cardiologist:  Mayford Knife Primary Electrophysiologist:  Will Jorja Loa, MD    Chief Complaint  Patient presents with  . Follow-up    Supraventicular tachycardia     History of Present Illness: Grace Tucker is a 57 y.o. female who presents today for electrophysiology evaluation.   She presented to the hospital on 05/15/15 after an episode of syncope.  She was under quite a bit of stress around the time of her syncope.  Prior to the syncope, she began feeling a funny feeling in her chest with lightheadedness and weakness, then collapsed.  She had 2-3 similar episodes over the next 30 minutes.  In the ER, she had several runs of SVT in the 180s.  She was treated with metoprolol 5 mg x2.  Had negative EP study 07/22/15.  He had continued and symptoms of palpitations and thus had a repeat EP study on 03/02/17 that showed no evidence of arrhythmia.  Today, denies symptoms of chest pain, shortness of breath, orthopnea, PND, lower extremity edema, claudication, dizziness, presyncope, syncope, bleeding, or neurologic sequela. The patient is tolerating medications without difficulties.  Is continued to have episodes of palpitations.  They have occurred more often over the last few weeks, though she is undergoing quite a bit of stress.  Her mother died 1 month ago and her daughter got married 2 weeks ago.  She is also stressed at work.  She does feel like her symptoms have not changed very much.  She was initially put on per path unknown, but she did not tolerate this.  Past Medical History:  Diagnosis Date  . Asthma   . Syncope 05/16/2015   In setting of SVT   Past Surgical History:  Procedure Laterality Date  . APPENDECTOMY    . CHOLECYSTECTOMY N/A 01/18/2013   Procedure: LAPAROSCOPIC CHOLECYSTECTOMY WITH INTRAOPERATIVE CHOLANGIOGRAM;   Surgeon: Velora Heckler, MD;  Location: [facility] OR;  Service: General;  Laterality: N/A;  . ELECTROPHYSIOLOGIC STUDY N/A 07/22/2015   Procedure: SVT Ablation;  Surgeon: Will Jorja Loa, MD;  Location: [facility];  Service: Cardiovascular;  Laterality: N/A;  . SVT ABLATION N/A 03/02/2017   Procedure: SVT ABLATION;  Surgeon: Regan Lemming, MD;  Location: [facility];  Service: Cardiovascular;  Laterality: N/A;     Current Outpatient Medications  Medication Sig Dispense Refill  . albuterol (PROVENTIL HFA;VENTOLIN HFA) 108 (90 Base) MCG/ACT inhaler Inhale 2 puffs into the lungs every 6 (six) hours as needed for wheezing or shortness of breath.    . Chlorpheniramine-PSE-Ibuprofen (ADVIL ALLERGY SINUS PO) Take 1 tablet by mouth daily as needed (allergies).    Marland Kitchen ibuprofen (ADVIL,MOTRIN) 200 MG tablet Take 200 mg by mouth every 6 (six) hours as needed for moderate pain.    . ranitidine (ZANTAC) 150 MG capsule Take 150 mg by mouth daily as needed for heartburn.     No current facility-administered medications for this visit.     Allergies:   Flecainide and Ceclor [cefaclor]   Social History:  The patient  reports that she has never smoked. She has never used smokeless tobacco. She reports that she does not drink alcohol or use drugs.   Family History:  The patient's family history includes Colon cancer in her mother.    ROS:  Please see the history of present illness.  Otherwise, review of systems is positive for palpitations.   All other systems are reviewed and negative.   PHYSICAL EXAM: VS:  BP 110/76   Pulse 65   Ht  (1.651 m)   Wt 154 lb 12.8 oz (70.2 kg)   SpO2 98%   BMI 25.76 kg/m  , BMI Body mass index is 25.76 kg/m. GEN: Well nourished, well developed, in no acute distress  HEENT: normal  Neck: no JVD, carotid bruits, or masses Cardiac: RRR; no murmurs, rubs, or gallops,no edema  Respiratory:  clear to auscultation bilaterally, normal work of  breathing GI: soft, nontender, nondistended, + BS MS: no deformity or atrophy  Skin: warm and dry Neuro:  Strength and sensation are intact Psych: euthymic mood, full affect  EKG:  EKG is ordered today. Personal review of the ekg ordered 03/29/17 shows SR, rate 64   Recent Labs: 03/02/2017: BUN 16; Creatinine, Ser 0.92; Hemoglobin 13.7; Platelets 274; Potassium 4.0; Sodium 139    Lipid Panel  No results found for: CHOL, TRIG, HDL, CHOLHDL, VLDL, LDLCALC, LDLDIRECT   Wt Readings from Last 3 Encounters:  06/28/17 154 lb 12.8 oz (70.2 kg)  03/29/17 157 lb (71.2 kg)  03/02/17 158 lb (71.7 kg)      Other studies Reviewed: Additional studies/ records that were reviewed today include: TTE 05/16/15  Review of the above records today demonstrates:  - Left ventricle: The cavity size was normal. Systolic function was  normal. The estimated ejection fraction was in the range of 50%  to 55%. Wall motion was normal; there were no regional wall  motion abnormalities. - Aortic valve: Trileaflet; normal thickness, mildly calcified  leaflets.  Holter 03/15/16 - personally reviewed Minimum HR: 51 BPM at 3:51:51 AM Maximum HR: 124 BPM at 3:10:58 PM(2) Average HR: 72 BPM 0% PVCs, <1% APCs Sinus rhythm Occasional SVT with rates 115 BPM  ASSESSMENT AND PLAN:  1.  SVT: 2- EP studies with junctional rhythm seen on the second.  There is no evidence of other arrhythmias.  Decided to treat with medical management.  She has not tolerated flecainide or propafenone.  She also has not tolerated beta-blockers.  We will try to increase her diltiazem to see if this will help with her palpitations.  Current medicines are reviewed at length with the patient today.   The patient does not have concerns regarding her medicines.  The following changes were made today: Increase diltiazem  Proceduredered today include:  No orders of the defined types were placed in this encounter.    Disposition:   FU with  Will Camnitz 6 months  Signed, Will Jorja Loa, MD  06/28/2017 9:20 AM     [facility] [address] [place] [address] is [phone] (fax)

## 2017-06-28 NOTE — Patient Instructions (Signed)
Medication Instructions:  Your physician has recommended you make the following change in your medication:  1. INCREASE Diltiazem to 360 mg daily  Labwork: None ordered  Testing/Procedures: None ordered  Follow-Up: Your physician wants you to follow-up in: 6 months with Dr. Elberta Fortis.  You will receive a reminder letter in the mail two months in advance. If you don't receive a letter, please call our office to schedule the follow-up appointment.  * If you need a refill on your cardiac medications before your next appointment, please call your pharmacy.   *Please note that any paperwork needing to be filled out by the provider will need to be addressed at the front desk prior to seeing the provider. Please note that any FMLA, disability or other documents regarding health condition is subject to a $25.00 charge that must be received prior to completion of paperwork in the form of a money order or check.  Thank you for choosing [facility]!!   Dory Horn, RN [phone]  Any Other Special Instructions Will Be Listed Below (If Applicable).

## 2021-04-30 ENCOUNTER — Ambulatory Visit: Payer: Self-pay | Admitting: Family Medicine

## 2021-05-28 ENCOUNTER — Ambulatory Visit: Payer: Self-pay | Admitting: Family Medicine
